# Patient Record
Sex: Male | Born: 1970 | Hispanic: Yes | Marital: Married | Smoking: Never smoker
Health system: Southern US, Community
[De-identification: ages and names within clinical notes are randomized; demographics above are authoritative.]

## PROBLEM LIST (undated history)

## (undated) DIAGNOSIS — M1990 Unspecified osteoarthritis, unspecified site: Secondary | ICD-10-CM

## (undated) HISTORY — PX: NO PAST SURGERIES: SHX2092

---

## 2002-01-14 ENCOUNTER — Encounter: Payer: Self-pay | Admitting: Internal Medicine

## 2002-01-14 ENCOUNTER — Ambulatory Visit (HOSPITAL_COMMUNITY): Admission: RE | Admit: 2002-01-14 | Discharge: 2002-01-14 | Payer: Self-pay | Admitting: Internal Medicine

## 2005-08-22 ENCOUNTER — Ambulatory Visit: Payer: Self-pay | Admitting: Family Medicine

## 2005-08-27 ENCOUNTER — Ambulatory Visit: Payer: Self-pay | Admitting: Family Medicine

## 2007-10-21 ENCOUNTER — Ambulatory Visit: Payer: Self-pay | Admitting: Family Medicine

## 2012-06-09 ENCOUNTER — Encounter (HOSPITAL_COMMUNITY): Payer: Self-pay

## 2012-06-09 ENCOUNTER — Emergency Department (INDEPENDENT_AMBULATORY_CARE_PROVIDER_SITE_OTHER): Payer: Self-pay

## 2012-06-09 ENCOUNTER — Emergency Department (INDEPENDENT_AMBULATORY_CARE_PROVIDER_SITE_OTHER)
Admission: EM | Admit: 2012-06-09 | Discharge: 2012-06-09 | Disposition: A | Discharge status: Home / Self Care | Payer: Self-pay | Source: Home / Self Care | Attending: Family Medicine | Admitting: Family Medicine

## 2012-06-09 DIAGNOSIS — S6990XA Unspecified injury of unspecified wrist, hand and finger(s), initial encounter: Secondary | ICD-10-CM

## 2012-06-09 DIAGNOSIS — S63619A Unspecified sprain of unspecified finger, initial encounter: Secondary | ICD-10-CM

## 2012-06-09 DIAGNOSIS — S6390XA Sprain of unspecified part of unspecified wrist and hand, initial encounter: Secondary | ICD-10-CM

## 2012-06-09 DIAGNOSIS — S6980XA Other specified injuries of unspecified wrist, hand and finger(s), initial encounter: Secondary | ICD-10-CM

## 2012-06-09 MED ORDER — MELOXICAM 7.5 MG PO TABS: 7.5000 mg | ORAL_TABLET | Freq: Every day | ORAL | Status: DC

## 2012-06-09 NOTE — ED Notes (Signed)
59 week old injury from volley ball left ring finger PIP joint reddened and swollen

## 2012-06-09 NOTE — ED Provider Notes (Signed)
History     CSN: [id number]  Arrival date & time 06/09/12  1609   First MD Initiated Contact with Patient 06/09/12 1759      Chief Complaint  Patient presents with  . Finger Injury    (Consider location/radiation/quality/duration/timing/severity/associated sxs/prior treatment) The history is provided by the patient.   Delwyn Scoggin is a 41 y.o. male who sustained a right ring finger injury 3 weeks ago.  Mechanism of injury: playing volleyball, finger hyperextension.  Immediate symptoms: pain, "popping sound". Symptoms have been improved but persistent redness and swelling since that time. Has worn a finger splint for the past three weeks, expresses concern regarding inability to bend finger.  No prior history of finger injury.   History reviewed. No pertinent past medical history.  History reviewed. No pertinent past surgical history.  History reviewed. No pertinent family history.  History  Substance Use Topics  . Smoking status: Not on file  . Smokeless tobacco: Not on file  . Alcohol Use: Not on file      Review of Systems  Constitutional: Negative.   Respiratory: Negative.   Cardiovascular: Negative.   Musculoskeletal: Positive for joint swelling and arthralgias. Negative for myalgias, back pain and gait problem.  Skin: Negative.   Neurological: Negative.     Allergies  Review of patient's allergies indicates not on file.  Home Medications   Current Outpatient Rx  Name Route Sig Dispense Refill  . MELOXICAM 7.5 MG PO TABS Oral Take 1 tablet (7.5 mg total) by mouth daily. 30 tablet 1    BP 130/82  Pulse 56  Temp 99 F (37.2 C) (Oral)  Resp 16  SpO2 98%  Physical Exam  Nursing note and vitals reviewed. Constitutional: He is oriented to person, place, and time. Vital signs are normal. He appears well-developed and well-nourished. He is active and cooperative.  HENT:  Head: Normocephalic.  Eyes: Conjunctivae normal are normal. Pupils are equal,  round, and reactive to light. No scleral icterus.  Neck: Trachea normal. Neck supple.  Cardiovascular: Normal rate, regular rhythm, normal heart sounds and intact distal pulses.   Pulmonary/Chest: Effort normal and breath sounds normal.  Musculoskeletal:       Left wrist: Normal.       Right hand: Normal.       Hands:      Redness at fourth pip, pt unable to make fist, swelling at joint.  Neurological: He is alert and oriented to person, place, and time. No cranial nerve deficit or sensory deficit.  Skin: Skin is warm and dry.  Psychiatric: He has a normal mood and affect. His speech is normal and behavior is normal. Judgment and thought content normal. Cognition and memory are normal.    ED Course  Procedures (including critical care time)  Labs Reviewed - No data to display Dg Finger Ring Left  06/09/2012  *RADIOLOGY REPORT*  Clinical Data: Sports injury fourth digit  LEFT RING FINGER 2+V  Comparison: None.  Findings: There is soft tissue swelling along the fourth digit.  No fracture or dislocation.  No foreign body.  IMPRESSION:  1.  No fracture or dislocation of the fourth digit. 2.   Soft tissue swelling.   Original Report Authenticated By: Genevive Bi, M.D.      1. Finger injury   2. Finger sprain       MDM  NSAIDS, Buddy tape fingers.  Follow up with ortho this week for further evaluation and treatment.  Johnsie Kindred, NP 06/09/12 1924

## 2012-06-13 NOTE — ED Provider Notes (Signed)
Medical screening examination/treatment/procedure(s) were performed by resident physician or non-physician practitioner and as supervising physician I was immediately available for consultation/collaboration.   Tony Granquist DOUGLAS MD.    Cassey Bacigalupo D Donata Reddick, MD 06/13/12 0927 

## 2014-06-02 ENCOUNTER — Ambulatory Visit (INDEPENDENT_AMBULATORY_CARE_PROVIDER_SITE_OTHER): Payer: 59 | Admitting: Medical

## 2014-06-02 ENCOUNTER — Encounter: Payer: Self-pay | Admitting: Medical

## 2014-06-02 ENCOUNTER — Ambulatory Visit: Payer: Self-pay | Admitting: Medical

## 2014-06-02 VITALS — BP 140/90 | HR 70 | Temp 98.1°F | Resp 16 | Ht 68.0 in | Wt 215.0 lb

## 2014-06-02 DIAGNOSIS — R42 Dizziness and giddiness: Secondary | ICD-10-CM

## 2014-06-02 DIAGNOSIS — M25519 Pain in unspecified shoulder: Secondary | ICD-10-CM

## 2014-06-02 DIAGNOSIS — M25569 Pain in unspecified knee: Secondary | ICD-10-CM

## 2014-06-02 DIAGNOSIS — M25511 Pain in right shoulder: Secondary | ICD-10-CM

## 2014-06-02 DIAGNOSIS — R030 Elevated blood-pressure reading, without diagnosis of hypertension: Secondary | ICD-10-CM

## 2014-06-02 DIAGNOSIS — M25562 Pain in left knee: Secondary | ICD-10-CM

## 2014-06-02 MED ORDER — MECLIZINE HCL 25 MG PO TABS: 25.0000 mg | ORAL_TABLET | Freq: Two times a day (BID) | ORAL | Status: DC
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
## 2015-10-18 ENCOUNTER — Ambulatory Visit (INDEPENDENT_AMBULATORY_CARE_PROVIDER_SITE_OTHER): Payer: BLUE CROSS/BLUE SHIELD | Admitting: Physician Assistant

## 2015-10-18 VITALS — BP 124/80 | HR 59 | Temp 98.7°F | Resp 17 | Ht 68.0 in | Wt 229.0 lb

## 2015-10-18 DIAGNOSIS — R519 Headache, unspecified: Secondary | ICD-10-CM

## 2015-10-18 DIAGNOSIS — R51 Headache: Secondary | ICD-10-CM | POA: Diagnosis not present

## 2015-10-18 DIAGNOSIS — R1012 Left upper quadrant pain: Secondary | ICD-10-CM | POA: Diagnosis not present

## 2015-10-18 LAB — POCT URINALYSIS DIP (MANUAL ENTRY)
Bilirubin, UA: NEGATIVE
Blood, UA: NEGATIVE
Glucose, UA: NEGATIVE
Ketones, POC UA: NEGATIVE
Nitrite, UA: NEGATIVE
Protein Ur, POC: NEGATIVE
Spec Grav, UA: 1.025
Urobilinogen, UA: 0.2
pH, UA: 6.5

## 2015-10-18 LAB — POCT CBC
Granulocyte percent: 67.9 %G (ref 37–80)
HCT, POC: 47.4 % (ref 43.5–53.7)
Hemoglobin: 16.8 g/dL (ref 14.1–18.1)
Lymph, poc: 1.9 (ref 0.6–3.4)
MCH, POC: 30.3 pg (ref 27–31.2)
MCHC: 35.4 g/dL (ref 31.8–35.4)
MCV: 85.6 fL (ref 80–97)
MID (cbc): 0.5 (ref 0–0.9)
MPV: 7.4 fL (ref 0–99.8)
POC Granulocyte: 5.2 (ref 2–6.9)
POC LYMPH PERCENT: 25.4 %L (ref 10–50)
POC MID %: 6.7 %M (ref 0–12)
Platelet Count, POC: 235 10*3/uL (ref 142–424)
RBC: 5.53 M/uL (ref 4.69–6.13)
RDW, POC: 13.1 %
WBC: 7.6 10*3/uL (ref 4.6–10.2)

## 2015-10-18 LAB — POC MICROSCOPIC URINALYSIS (UMFC)

## 2015-10-18 LAB — POCT SEDIMENTATION RATE: POCT SED RATE: 15 mm/h (ref 0–22)

## 2015-10-18 MED ORDER — IBUPROFEN 600 MG PO TABS: 600.0000 mg | ORAL_TABLET | Freq: Three times a day (TID) | ORAL | Status: DC | PRN

## 2015-10-18 NOTE — Progress Notes (Signed)
[facility] [address], [place] [address] [phone] [phone]  Date:  10/18/2015   Name:  [name]   DOB:  01/02/1971   MRN:  [id number]  PCP:  Ernst Breach, PA-C   Chief Complaint  Patient presents with  . Chest Pain  . Headache    History of Present Illness:  [name] is a 45 y.o. male patient who presents to [facility] for cc of chest pain.   Left sided chest pain 3-4 years of pain.    1 week ago, he was laying on the ground, starts hurting a little pain.  Aggravated by movement.  It is aggravated by deep breathing.  The bowel movements have no blood, however they are green.  He is not taking anything for this.  Worrying also aggravates the pain.   Pain at right temple started 4 months ago, that comes and goes.  Touching hurts.  There is some numbness when it happens.  There is only mild pain.  Vision has slowly become blurry.  Head pain is not associated with vision changes.  No chest pain, diaphoresis, shortness of breath, or nausea.  The right side of his head with pain.     There are no active problems to display for this patient.   No past medical history on file.  No past surgical history on file.  Social History  Substance Use Topics  . Smoking status: Never Smoker   . Smokeless tobacco: None  . Alcohol Use: None    No family history on file.  No Known Allergies  Medication list has been reviewed and updated.  Current Outpatient Prescriptions on File Prior to Visit  Medication Sig Dispense Refill  . cetirizine (ZYRTEC) 10 MG tablet Take 10 mg by mouth daily.    . meclizine (ANTIVERT) 25 MG tablet Take 1 tablet (25 mg total) by mouth 2 (two) times daily. (Patient not taking: Reported on 10/18/2015) 30 tablet 0   No current facility-administered medications on file prior to visit.    ROS ROS otherwise unremarkable unless listed above.    Physical Examination: BP 124/80 mmHg  Pulse 59  Temp(Src) 98.7 F (37.1 C) (Oral)   Resp 17  Ht  (1.727 m)  Wt 229 lb (103.874 kg)  BMI 34.83 kg/m2  SpO2 99% Ideal Body Weight: Weight in (lb) to have BMI = 25: 164.1  Physical Exam  Constitutional: He is oriented to person, place, and time. He appears well-developed and well-nourished. No distress.  HENT:  Head: Normocephalic and atraumatic.  Eyes: Conjunctivae and EOM are normal. Pupils are equal, round, and reactive to light.  Cardiovascular: Normal rate, regular rhythm and intact distal pulses.  Exam reveals no friction rub.   No murmur heard. Pulses:      Radial pulses are 2+ on the right side, and 2+ on the left side.       Dorsalis pedis pulses are 2+ on the right side, and 2+ on the left side.  Pulmonary/Chest: Effort normal. No accessory muscle usage. No apnea. No respiratory distress. He has no decreased breath sounds. He has no wheezes. He has no rhonchi. He exhibits no tenderness and no swelling.  Abdominal: Soft. Normal appearance and bowel sounds are normal. There is no hepatosplenomegaly. There is tenderness.    l quadrant nodule at the abdomen that is rounded, and mobile.  Tender at the ruq area.    Neurological: He is alert and oriented to person, place,  and time. Coordination normal.  Skin: Skin is warm and dry. He is not diaphoretic.  Psychiatric: He has a normal mood and affect. His behavior is normal.   Results for orders placed or performed in visit on 10/18/15  POCT CBC  Result Value Ref Range   WBC 7.6 4.6 - 10.2 K/uL   Lymph, poc 1.9 0.6 - 3.4   POC LYMPH PERCENT 25.4 10 - 50 %L   MID (cbc) 0.5 0 - 0.9   POC MID % 6.7 0 - 12 %M   POC Granulocyte 5.2 2 - 6.9   Granulocyte percent 67.9 37 - 80 %G   RBC 5.53 4.69 - 6.13 M/uL   Hemoglobin 16.8 14.1 - 18.1 g/dL   HCT, POC 16.1 09.6 - 53.7 %   MCV 85.6 80 - 97 fL   MCH, POC 30.3 27 - 31.2 pg   MCHC 35.4 31.8 - 35.4 g/dL   RDW, POC 04.5 %   Platelet Count, POC 235 142 - 424 K/uL   MPV 7.4 0 - 99.8 fL  POCT urinalysis dipstick    Result Value Ref Range   Color, UA yellow yellow   Clarity, UA clear clear   Glucose, UA negative negative   Bilirubin, UA negative negative   Ketones, POC UA negative negative   Spec Grav, UA 1.025    Blood, UA negative negative   pH, UA 6.5    Protein Ur, POC negative negative   Urobilinogen, UA 0.2    Nitrite, UA Negative Negative   Leukocytes, UA Trace (A) Negative  POCT Microscopic Urinalysis ([facility])  Result Value Ref Range   WBC,UR,HPF,POC Few (A) None WBC/hpf   RBC,UR,HPF,POC None None RBC/hpf   Bacteria Few (A) None, Too numerous to count   Mucus Present (A) Absent   Epithelial Cells, UR Per Microscopy Few (A) None, Too numerous to count cells/hpf   Assessment and Plan: [name] is a 45 y.o. male who is here today for cc of upper left abdomen pain.   Advised advil.  Cbc wnl.  This appears to be musculoskeletal.   At this time, patient would like neurology consult.  This appears to be a vague diagnosis of Bell's.  Sed rate appears unconcerning.    LUQ abdominal pain - Plan: POCT CBC, POCT urinalysis dipstick, POCT Microscopic Urinalysis ([facility]), US Abdomen Complete  Headache, unspecified headache type - Plan: ibuprofen (ADVIL,MOTRIN) 600 MG tablet, POCT SEDIMENTATION RATE, Ambulatory referral to Neurology  Trena Platt, PA-C [facility] 1/31/20178:56 PM

## 2015-10-18 NOTE — Patient Instructions (Addendum)
Your blood work appears fine.   I will order a metabolic panel to check your kidney function at this time. I would like to have an ultrasound.  Please await contact for imaging appointment.   You can take ibuprofen  every 8 hours for the pain.  Make sure that you eat first.

## 2015-10-27 ENCOUNTER — Other Ambulatory Visit: Payer: Self-pay

## 2015-10-28 ENCOUNTER — Other Ambulatory Visit: Payer: Self-pay | Admitting: *Deleted

## 2015-10-28 ENCOUNTER — Ambulatory Visit: Admission: RE | Admit: 2015-10-28 | Payer: BLUE CROSS/BLUE SHIELD | Source: Ambulatory Visit

## 2015-11-04 ENCOUNTER — Ambulatory Visit: Payer: BLUE CROSS/BLUE SHIELD | Admitting: Diagnostic Neuroimaging

## 2016-07-25 ENCOUNTER — Ambulatory Visit (INDEPENDENT_AMBULATORY_CARE_PROVIDER_SITE_OTHER): Payer: BLUE CROSS/BLUE SHIELD | Admitting: Urgent Care

## 2016-07-25 VITALS — BP 110/68 | HR 64 | Temp 98.6°F | Resp 18 | Ht 68.0 in | Wt 232.0 lb

## 2016-07-25 DIAGNOSIS — K429 Umbilical hernia without obstruction or gangrene: Secondary | ICD-10-CM

## 2016-07-25 DIAGNOSIS — G8929 Other chronic pain: Secondary | ICD-10-CM

## 2016-07-25 DIAGNOSIS — Z87828 Personal history of other (healed) physical injury and trauma: Secondary | ICD-10-CM

## 2016-07-25 DIAGNOSIS — R51 Headache: Secondary | ICD-10-CM

## 2016-07-25 DIAGNOSIS — R0789 Other chest pain: Secondary | ICD-10-CM | POA: Diagnosis not present

## 2016-07-25 LAB — POCT URINALYSIS DIP (MANUAL ENTRY)
BILIRUBIN UA: NEGATIVE
BILIRUBIN UA: NEGATIVE
GLUCOSE UA: NEGATIVE
LEUKOCYTES UA: NEGATIVE
Nitrite, UA: NEGATIVE
Protein Ur, POC: NEGATIVE
Spec Grav, UA: 1.025
Urobilinogen, UA: 0.2
pH, UA: 5.5

## 2016-07-25 NOTE — Patient Instructions (Addendum)
Dolor de cabeza general sin causa (General Headache Without Cause) El dolor de cabeza es un dolor o malestar que se siente en la zona de la cabeza o del cuello. Puede no tener una causa especfica. Hay muchas causas y tipos de dolores de Turkmenistancabeza. Los dolores de cabeza ms comunes son los siguientes:  Cefalea tensional.  Cefaleas migraosas.  Cefalea en brotes.  Cefaleas diarias crnicas. INSTRUCCIONES PARA EL CUIDADO EN EL HOGAR  Controle su afeccin para ver si hay cambios. Siga estos pasos para Scientist, physiologicalcontrolar la afeccin: Control del Reynolds Americandolor  Tome los medicamentos de venta libre y los recetados solamente como se lo haya indicado el mdico.  Cuando sienta dolor de cabeza acustese en un cuarto oscuro y tranquilo.  Si se lo indican, aplique hielo sobre la cabeza y la zona del cuello:  Ponga el hielo en una bolsa plstica.  Coloque una toalla entre la piel y la bolsa de hielo.  Coloque el hielo durante 20minutos, 2 a 3veces por Futures traderda.  Utilice una almohadilla trmica o tome una ducha con agua caliente para aplicar calor en la cabeza y la zona del cuello como se lo haya indicado el mdico.  Mantenga las luces tenues si le Liz Claibornemolesta las luces brillantes o sus dolores de cabeza empeoran. Comida y bebida  Mantenga un horario para las comidas.  Limite el consumo de bebidas alcohlicas.  Consuma menos cantidad de cafena o deje de tomarla. Instrucciones generales  Concurra a todas las visitas de control como se lo haya indicado el mdico. Esto es importante.  Lleve un diario de los dolores de cabeza para Financial risk analystaveriguar qu factores pueden desencadenarlos. Por ejemplo, escriba los siguientes datos:  Lo que usted come y Estate agentbebe.  Cunto tiempo duerme.  Algn cambio en su dieta o en los medicamentos.  Pruebe algunas tcnicas de relajacin, como los Stewartvillemasajes.  Limite el estrs.  Sintese con la espalda recta y no tense los msculos.  No consuma productos que contengan tabaco, incluidos  cigarrillos, tabaco de Theatre managermascar o cigarrillos electrnicos. Si necesita ayuda para dejar de fumar, consulte al mdico.  Haga actividad fsica habitualmente como se lo haya indicado el mdico.  Tenga un horario fijo para dormir. Duerma entre 7 y 9horas o la cantidad de horas que le haya recomendado el mdico. SOLICITE ATENCIN MDICA SI:   Los medicamentos no Materials engineerlogran aliviar los sntomas.  Tiene un dolor de cabeza que es diferente del dolor de cabeza habitual.  Tiene nuseas o vmitos.  Tiene fiebre. SOLICITE ATENCIN MDICA DE INMEDIATO SI:   El dolor se hace cada vez ms intenso.  Ha vomitado repetidas veces.  Presenta rigidez en el cuello.  Sufre prdida de la visin.  Tiene problemas para hablar.  Siente dolor en el ojo o en el odo.  Presenta debilidad muscular o prdida del control muscular.  Pierde el equilibrio o tiene problemas para Advertising account plannercaminar.  Sufre mareos o se desmaya.  Se siente confundido.   Esta informacin no tiene Theme park managercomo fin reemplazar el consejo del mdico. Asegrese de hacerle al mdico cualquier pregunta que tenga.   Document Released: 06/13/2005 Document Revised: 05/25/2015 Elsevier Interactive Patient Education 2016 Arvin[employer]Elsevier Inc.     Dolor de pecho inespecfico  (Nonspecific Chest Pain) El dolor de pecho puede deberse a muchas enfermedades diferentes. Siempre existe una posibilidad de que el dolor est relacionado con algo grave, como un infarto de miocardio o un cogulo sanguneo en los pulmones. Hay muchas enfermedades que no son potencialmente mortales que pueden causar dolor  de pecho. Si tiene Engineer, mining de Hotel manager, es muy importante que se controle con el mdico. CAUSAS  Las causas del dolor de pecho pueden ser las siguientes:  Acidez estomacal.  Neumona o bronquitis.  Ansiedad o estrs.  Inflamacin de la zona que rodea al corazn (pericarditis) o a los pulmones (pleuritis o pleuresa).  Un cogulo sanguneo en el pulmn.  Colapso de un  pulmn (neumotrax), que puede aparecer de [employer] repentina por s solo (neumotrax espontneo) o debido a un traumatismo en el trax.  Culebrilla (virus de la varicela zster).  Infarto de miocardio.  Dao de los [place], los msculos y los cartlagos que conforman la pared torcica. Esto puede incluir lo siguiente:  Hematomas seos debido a lesiones.  Distensiones musculares o de los cartlagos por tos frecuente o repetida, o por exceso de trabajo.  Fractura de una o ms costillas.  Dolor de TEFL teacher debido a inflamacin (costocondritis). FACTORES DE RIESGO  Los factores de riesgo de tener dolor de pecho pueden incluir lo siguiente:  Actividades que incrementan el riesgo de sufrir traumatismos o lesiones en el trax.  Infecciones o enfermedades respiratorias que causan tos frecuente.  Enfermedades o [employer] comidas que pueden causar Engineering geologist.  Enfermedades cardacas o antecedentes familiares de enfermedades cardacas.  Enfermedades o comportamientos de salud que aumentan el riesgo de tener un cogulo sanguneo.  Haber tenido varicela (varicela zster). SIGNOS Y SNTOMAS El dolor de pecho puede provocar las siguientes sensaciones:  Ardor u hormigueo en la superficie o en lo profundo del pecho.  Dolor opresivo, continuo o constrictivo.  Dolor vago o intenso que empeora al [employer], toser o inhalar profundamente.  Dolor que tambin se siente en la espalda, el cuello, el hombro o el brazo, o dolor que se irradia a cualquiera de estas zonas. El dolor de pecho puede aparecer y Geneticist, molecular, o bien puede ser constante. DIAGNSTICO Gretchen Short se necesiten anlisis de laboratorio u otros estudios para Veterinary surgeon causa del Engineer, mining. El mdico puede indicarle que se haga una prueba llamada EGC (electrocadiograma) ambulatorio. El Regulatory affairs officer los patrones de los latidos cardacos en el momento en que se realiza el [place]. Tambin pueden hacerle otros estudios, por  ejemplo:  Ecocardiograma transtorcico (ETT). Durante el ecocardiograma, se usan ondas sonoras para crear una imagen de todas las estructuras cardacas y evaluar cmo circula la sangre por el corazn.  Ecocardiograma transesofgico (ETE).Este es un estudio de diagnstico por imgenes ms avanzado que el obtiene imgenes del interior del cuerpo. Le permite al mdico ver el corazn con mayor detalle.  Monitoreo cardaco. Permite que el mdico controle la frecuencia y el ritmo cardaco en tiempo real.  Monitor Holter. Es un dispositivo porttil que eBay latidos del corazn y puede ayudar a Education administrator las arritmias cardacas. Le permite al American Express registrar la actividad cardaca durante varios das, si es necesario.  Pruebas de esfuerzo. Estas pueden realizarse durante el ejercicio o mediante la administracin de un medicamento que acelera los latidos del corazn.  Anlisis de [place].  Diagnstico por imgenes. TRATAMIENTO  El tratamiento depende de la causa del dolor de [place]. El tratamiento puede incluir lo siguiente:  Medicamentos. Estos pueden incluir lo siguiente:  Inhibidores de Publishing copy.  Antiinflamatorios.  Analgsicos para las enfermedades inflamatorias.  Antibiticos, si hay una infeccin.  Medicamentos para [employer].  Medicamentos para tratar la enfermedad arterial coronaria.  Tratamiento complementario para las enfermedades que no requieren la toma de medicamentos. Esto puede incluir lo siguiente:  Descansar.  Aplicar compresas fras o calientes en las zonas lesionadas.  Limitar las actividades hasta que Erie Insurance Groupdisminuya el dolor. INSTRUCCIONES PARA EL CUIDADO EN EL HOGAR  Si le recetaron antibiticos, asegrese de terminarlos, incluso si comienza a sentirse mejor.  Evite las SUPERVALU INCactividades que le causen dolor de Ottumwapecho.  No consuma ningn producto que contenga tabaco, lo que incluye cigarrillos, tabaco de Theatre managermascar o Soil scientistcigarrillos  electrnicos. Si necesita ayuda para dejar de fumar, consulte al mdico.  No beba alcohol.  Tome los medicamentos solamente como se lo haya indicado el mdico.  Concurra a todas las visitas de control como se lo haya indicado el mdico. Esto es importante. Esto incluye otros estudios si el dolor de pecho no desaparece.  Si la acidez es la causa del dolor de Eriepecho, tal vez le aconsejen que mantenga la cabeza levantada (elevada) mientras duerme. Esto reduce la probabilidad de que el cido retroceda del estmago al esfago.  Haga cambios en su estilo de vida como se lo haya indicado el mdico. Estos pueden incluir lo siguiente:  Education administratorracticar actividad fsica con regularidad. Pida al mdico que le sugiera algunas actividades que sean seguras para usted.  Consumir una dieta cardiosaludable. Un nutricionista matriculado puede ayudarlo a Software engineerhacer elecciones saludables.  Mantener un peso saludable.  Controlar la diabetes, si es necesario.  Reducir las situaciones de estrs. SOLICITE ATENCIN MDICA SI:  El dolor de pecho no desaparece despus del tratamiento.  Tiene una erupcin cutnea con ampollas en el pecho.  Tiene fiebre. SOLICITE ATENCIN MDICA DE INMEDIATO SI:   El dolor en el pecho es ms intenso.  La tos empeora, o expectora sangre.  Siente un dolor abdominal intenso.  Siente debilidad intensa.  Se desmaya.  Tiene escalofros.  Tiene una molestia repentina e inexplicable en el pecho.  Tiene molestias repentinas e Exxon Mobil Corporationinexplicables en los brazos, la espalda, el cuello o la Tonopahmandbula.  Le falta el aire en cualquier momento.  Comienza a sudar de Hondurasmanera repentina o la piel se le humedece.  Siente nuseas o vomita.  Se siente repentinamente mareado o se desmaya.  Siente que el corazn comienza a latir rpidamente o que se saltea latidos. Estos sntomas pueden representar un problema grave que constituye Radio broadcast assistantuna emergencia. No espere hasta que los sntomas desaparezcan. Solicite  atencin mdica de inmediato. Comunquese con el servicio de emergencias de su localidad (911 en los [place]). No conduzca por sus propios medios OfficeMax Incorporatedhasta el hospital.   Esta informacin no tiene Theme park managercomo fin reemplazar el consejo del mdico. Asegrese de hacerle al mdico cualquier pregunta que tenga.   Document Released: 09/03/2005 Document Revised: 09/24/2014 Elsevier Interactive Patient Education Yahoo[employer] Inc2016 Elsevier Inc.     IF you received an x-ray today, you will receive an invoice from [facility]. Please contact [facility] at [phone] with questions or concerns regarding your invoice.   IF you received labwork today, you will receive an invoice from [facility]. Please contact [facility] at [phone] with questions or concerns regarding your invoice.   Our billing staff will not be able to assist you with questions regarding bills from these companies.  You will be contacted with the lab results as soon as they are available. The fastest way to get your results is to activate your My Chart account. Instructions are located on the last page of this paperwork. If you have not heard from us regarding the results in 2 weeks, please contact this office.

## 2016-07-25 NOTE — Progress Notes (Signed)
MRN: [id number] DOB: [birth year]  Subjective:   [name] is a 45 y.o. male presenting for chief complaint of HEAD PAIN  Reports ~2.5 year history of intermittent headaches, s/[place] head [place] from [place]. Patient was hospitalized for this, has not had f/u or neurology referral since then. In the past 2 months they became worse. Headache is located over occiput, can last hours, associated with tingling, slightly blurred vision. Has tried naproxen with some relief.   After physical exam, patient reports several month history of intermittent mid-sternal chest pain, not associated with strenuous activity or eating. This is elicited with bending. He feels it is more chest wall type pain. Denies diaphoresis, heart racing, palpitations, radiation of his chest wall pain to his neck, jaw, left arm, n/v, abdominal pain. Denies smoking cigarettes.  Dominga Ferrygustin currently has no medications in their medication list. Also has No Known Allergies.  Dominga Ferrygustin  has no past medical history on file. Also  has no past surgical history on file.   Objective:   Vitals: BP 110/68 (BP Location: Right Arm, Patient Position: Sitting, Cuff Size: Large)   Pulse 64   Temp 98.6 F (37 C) (Oral)   Resp 18   Ht 5\' 8"  (1.727 m)   Wt 232 lb (105.2 kg)   SpO2 98%   BMI 35.28 kg/m   Physical Exam  Constitutional: He is oriented to person, place, and time. He appears well-developed and well-nourished.  HENT:  TM's intact bilaterally, no effusions or erythema. Nasal turbinates pink and moist, nasal passages patent. No sinus tenderness. Oropharynx clear, mucous membranes moist, dentition in good repair.  Eyes: EOM are normal. Pupils are equal, round, and reactive to light. No scleral icterus.  Cardiovascular: Normal rate, regular rhythm and intact distal pulses.  Exam reveals no gallop and no friction rub.   No murmur heard. Pulmonary/Chest: No respiratory distress. He has no wheezes. He has no rales.    Abdominal: Soft. Bowel sounds are normal. He exhibits no distension and no mass. There is no tenderness. A hernia (umbilical) is present.  Neurological: He is alert and oriented to person, place, and time. No cranial nerve deficit. Coordination normal.  Skin: Skin is warm and dry.   Results for orders placed or performed in visit on 07/25/16 (from the past 24 hour(s))  POCT urinalysis dipstick     Status: Abnormal   Collection Time: 07/25/16  6:24 PM  Result Value Ref Range   Color, UA yellow yellow   Clarity, UA clear clear   Glucose, UA negative negative   Bilirubin, UA negative negative   Ketones, POC UA negative negative   Spec Grav, UA 1.025    Blood, UA trace-intact (A) negative   pH, UA 5.5    Protein Ur, POC negative negative   Urobilinogen, UA 0.2    Nitrite, UA Negative Negative   Leukocytes, UA Negative Negative   ECG interpretation - sinus rhythm.  Assessment and Plan :   1. Chronic nonintractable headache, unspecified headache type 2. History of head injury without fracture of skull - May be due to dehydration, stress. Counseled patient on lifestyle modifications. Labs are pending. Consider referral to Neuro.  3. Chest wall pain - Monitor, f/u with annual physical exam.  4. Umbilical hernia without obstruction and without gangrene - Offered referral to general surgery for consult, patient refused. - Comprehensive metabolic panel   Wallis BambergMario Jaciel Diem, PA-C [facility] [phone] 07/25/2016 6:00  PM

## 2016-07-26 LAB — COMPREHENSIVE METABOLIC PANEL
ALK PHOS: 85 U/L (ref 40–115)
ALT: 36 U/L (ref 9–46)
AST: 27 U/L (ref 10–40)
Albumin: 4.4 g/dL (ref 3.6–5.1)
BILIRUBIN TOTAL: 0.5 mg/dL (ref 0.2–1.2)
BUN: 16 mg/dL (ref 7–25)
CO2: 26 mmol/L (ref 20–31)
CREATININE: 0.99 mg/dL (ref 0.60–1.35)
Calcium: 9.5 mg/dL (ref 8.6–10.3)
Chloride: 103 mmol/L (ref 98–110)
GLUCOSE: 100 mg/dL — AB (ref 65–99)
Potassium: 3.9 mmol/L (ref 3.5–5.3)
SODIUM: 137 mmol/L (ref 135–146)
Total Protein: 7.6 g/dL (ref 6.1–8.1)

## 2016-07-26 LAB — CBC
HCT: 46.3 % (ref 38.5–50.0)
HEMOGLOBIN: 16.3 g/dL (ref 13.2–17.1)
MCH: 30.6 pg (ref 27.0–33.0)
MCHC: 35.2 g/dL (ref 32.0–36.0)
MCV: 87 fL (ref 80.0–100.0)
MPV: 10 fL (ref 7.5–12.5)
PLATELETS: 264 10*3/uL (ref 140–400)
RBC: 5.32 MIL/uL (ref 4.20–5.80)
RDW: 13.9 % (ref 11.0–15.0)
WBC: 7.2 10*3/uL (ref 3.8–10.8)

## 2016-07-26 LAB — HEMOGLOBIN A1C
HEMOGLOBIN A1C: 5.4 % (ref <5.7)
MEAN PLASMA GLUCOSE: 108 mg/dL

## 2017-09-25 DIAGNOSIS — M25562 Pain in left knee: Secondary | ICD-10-CM | POA: Diagnosis not present

## 2017-09-25 DIAGNOSIS — M1712 Unilateral primary osteoarthritis, left knee: Secondary | ICD-10-CM | POA: Diagnosis not present

## 2017-10-14 DIAGNOSIS — M1712 Unilateral primary osteoarthritis, left knee: Secondary | ICD-10-CM | POA: Diagnosis not present

## 2017-10-14 DIAGNOSIS — M25562 Pain in left knee: Secondary | ICD-10-CM | POA: Diagnosis not present

## 2017-10-21 DIAGNOSIS — M25562 Pain in left knee: Secondary | ICD-10-CM | POA: Diagnosis not present

## 2017-10-21 DIAGNOSIS — M1712 Unilateral primary osteoarthritis, left knee: Secondary | ICD-10-CM | POA: Diagnosis not present

## 2017-10-28 DIAGNOSIS — M791 Myalgia, unspecified site: Secondary | ICD-10-CM | POA: Diagnosis not present

## 2017-10-28 DIAGNOSIS — M25562 Pain in left knee: Secondary | ICD-10-CM | POA: Diagnosis not present

## 2017-10-28 DIAGNOSIS — M1712 Unilateral primary osteoarthritis, left knee: Secondary | ICD-10-CM | POA: Diagnosis not present

## 2018-01-27 ENCOUNTER — Ambulatory Visit (INDEPENDENT_AMBULATORY_CARE_PROVIDER_SITE_OTHER): Payer: BLUE CROSS/BLUE SHIELD

## 2018-01-27 ENCOUNTER — Ambulatory Visit: Payer: Self-pay

## 2018-01-27 ENCOUNTER — Ambulatory Visit: Payer: BLUE CROSS/BLUE SHIELD | Admitting: Family Medicine

## 2018-01-27 ENCOUNTER — Other Ambulatory Visit: Payer: Self-pay

## 2018-01-27 ENCOUNTER — Encounter: Payer: Self-pay | Admitting: Family Medicine

## 2018-01-27 VITALS — BP 108/80 | HR 76 | Temp 98.0°F | Resp 16 | Ht 68.9 in | Wt 232.0 lb

## 2018-01-27 DIAGNOSIS — R0789 Other chest pain: Secondary | ICD-10-CM | POA: Diagnosis not present

## 2018-01-27 DIAGNOSIS — R079 Chest pain, unspecified: Secondary | ICD-10-CM | POA: Diagnosis not present

## 2018-01-27 DIAGNOSIS — M542 Cervicalgia: Secondary | ICD-10-CM

## 2018-01-27 DIAGNOSIS — J301 Allergic rhinitis due to pollen: Secondary | ICD-10-CM

## 2018-01-27 DIAGNOSIS — R42 Dizziness and giddiness: Secondary | ICD-10-CM | POA: Diagnosis not present

## 2018-01-27 DIAGNOSIS — Z8669 Personal history of other diseases of the nervous system and sense organs: Secondary | ICD-10-CM | POA: Diagnosis not present

## 2018-01-27 DIAGNOSIS — M1712 Unilateral primary osteoarthritis, left knee: Secondary | ICD-10-CM | POA: Diagnosis not present

## 2018-01-27 LAB — POCT URINALYSIS DIP (MANUAL ENTRY)
BILIRUBIN UA: NEGATIVE
BILIRUBIN UA: NEGATIVE mg/dL
GLUCOSE UA: NEGATIVE mg/dL
Leukocytes, UA: NEGATIVE
Nitrite, UA: NEGATIVE
Protein Ur, POC: NEGATIVE mg/dL
Spec Grav, UA: 1.025 (ref 1.010–1.025)
UROBILINOGEN UA: 0.2 U/dL
pH, UA: 5.5 (ref 5.0–8.0)

## 2018-01-27 LAB — GLUCOSE, POCT (MANUAL RESULT ENTRY): POC GLUCOSE: 86 mg/dL (ref 70–99)

## 2018-01-27 MED ORDER — MELOXICAM 15 MG PO TABS: 15.0000 mg | ORAL_TABLET | Freq: Every day | ORAL | 0 refills | Status: DC | PRN

## 2018-01-27 MED ORDER — MECLIZINE HCL 25 MG PO TABS: 25.0000 mg | ORAL_TABLET | Freq: Three times a day (TID) | ORAL | 0 refills | Status: DC | PRN

## 2018-01-27 NOTE — Telephone Encounter (Signed)
Patient's son, Tonie Griffith, called and patient gave consent to speak to the son. Christiane Ha says "my father got dizzy and sweating last night, but he says today he's better, just a little dizzy." I asked does he have hypertension, he says "the doctors have told him in the past he has hypertension, but he's not on medication for it." I asked is he able to check his BP at home, he says, "no." I asked is he having any other symptoms, he says "he was nauseated and had a little chest pain last night, but it went away and today nothing but a little dizzy. He can walk normally." According to protocol, see PCP within 3 days, he requested to be seen today if possible, appointment scheduled for today at 1720 with Dr. Katrinka Blazing, care advice given, son verbalized understanding.  Reason for Disposition . [1] MILD dizziness (e.g., walking normally) AND [2] has NOT been evaluated by physician for this  (Exception: dizziness caused by heat exposure, sudden standing, or poor fluid intake)  Answer Assessment - Initial Assessment Questions 1. DESCRIPTION: "Describe your dizziness."     Lightheaded 2. LIGHTHEADED: "Do you feel lightheaded?" (e.g., somewhat faint, woozy, weak upon standing)     Lightheaded 3. VERTIGO: "Do you feel like either you or the room is spinning or tilting?" (i.e. vertigo)     Not a lot, but a little 4. SEVERITY: "How bad is it?"  "Do you feel like you are going to faint?" "Can you stand and walk?"   - MILD - walking normally   - MODERATE - interferes with normal activities (e.g., work, school)    - SEVERE - unable to stand, requires support to walk, feels like passing out now.      Mild 5. ONSET:  "When did the dizziness begin?"     Last night 6. AGGRAVATING FACTORS: "Does anything make it worse?" (e.g., standing, change in head position)     Standing made it worse 7. HEART RATE: "Can you tell me your heart rate?" "How many beats in 15 seconds?"  (Note: not all patients can do this)        No 8. CAUSE: "What do you think is causing the dizziness?"     I don't know 9. RECURRENT SYMPTOM: "Have you had dizziness before?" If so, ask: "When was the last time?" "What happened that time?"     No 10. OTHER SYMPTOMS: "Do you have any other symptoms?" (e.g., fever, chest pain, vomiting, diarrhea, bleeding)       Nausea last night, chest hurting last night a little bit 11. PREGNANCY: "Is there any chance you are pregnant?" "When was your last menstrual period?"       N/A  Protocols used: DIZZINESS [facility]
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
## 2018-01-27 NOTE — Patient Instructions (Addendum)
IF you received an x-ray today, you will receive an invoice from [facility]. Please contact [facility] at [phone] with questions or concerns regarding your invoice.   IF you received labwork today, you will receive an invoice from [facility]. Please contact [facility] at [phone] with questions or concerns regarding your invoice.   Our billing staff will not be able to assist you with questions regarding bills from these companies.  You will be contacted with the lab results as soon as they are available. The fastest way to get your results is to activate your My Chart account. Instructions are located on the last page of this paperwork. If you have not heard from [place] regarding the results in 2 weeks, please contact this office.     Maniobra de Energy manager, cuidado personal Merchandiser, retail Self-Care) QU ES LA MANIOBRA DE EPLEY? La [employer] de Epley es un ejercicio que puede Education officer, environmental para Paramedic los sntomas del vrtigo posicional paroxstico benigno (VPPB). Esta afeccin a menudo se conoce como vrtigo. El movimiento de unos pequeos cristales (canalculos) dentro del odo interno ocasiona el VPPB. La acumulacin y el movimiento de los canalculos en el odo interno ocasionan una repentina sensacin de aceleracin (vrtigo) cuando se mueve la cabeza en ciertas posiciones. El vrtigo por lo general dura unos 30das. El VPPB normalmente ocurre solo en un odo. Si siente vrtigo cuando se recuesta sobre el lado izquierdo, probablemente tenga VPPB en el odo izquierdo. El mdico le puede decir qu odo est involucrado. Una lesin en la cabeza puede ocasionar el VPPB. Muchas personas de ms de 50aos tienen VPPB por motivos desconocidos. Si se le diagnostic VPPB, el mdico puede ensearle a Firefighter. El VPPB no es potencialmente mortal (benigno) y normalmente se pasa con el tiempo. CUNDO DEBO REALIZAR LA MANIOBRA DE EPLEY? Puede realizar [employer] en su  casa cuando tenga los sntomas de vrtigo. Puede realizar Musician de Epley hasta 3veces en un da hasta que los sntomas de vrtigo desaparezcan. CMO DEBO REALIZAR LA MANIOBRA DE EPLEY? 1. Sintese en el borde de una cama o una mesa con la espalda recta. Las piernas deben estar extendidas o colgando sobre el borde de la cama o la mesa. 2. Gire la cabeza a medias hacia el lado del odo afectado. 3. Recustese hacia atrs con la cabeza girada hasta que se encuentre recostado sobre la espalda. Quizs quiera colocar una almohada debajo de los hombros. 4. Mantenga esta posicin durante 30segundos. Es posible que experimente un ataque de vrtigo. Esto es normal. Mantenga esta posicin hasta que el vrtigo desaparezca. 5. Luego gire la cabeza en direccin opuesta hasta que el odo no afectado est orientado al suelo. 6. Mantenga esta posicin durante 30segundos. Es posible que experimente un ataque de vrtigo. Esto es normal. Mantenga esta posicin hasta que el vrtigo desaparezca. 7. Ahora gire todo el cuerpo hacia el mismo lado que la cabeza. Mantenga esta posicin durante otros 30segundos. 8. Luego, vuelva a sentarse.  ESTA MANIOBRA PRESENTA RIESGOS? En algunos casos, puede tener otros sntomas (como cambios en la visin, debilidad o entumecimiento). Si tiene estos sntomas, deje de Clinical cytogeneticist y llame al mdico. Baron Sane si realizar esta maniobra lo [place] del vrtigo, es posible que sienta mareos. El mareo es la sensacin de desvanecimiento pero sin la sensacin de [place]. Aunque la [employer] de Energy manager lo [place] del vrtigo, es posible que los sntomas vuelvan durante los siguientes 5aos. QU DEBO HACER DESPUS DE ESTA MANIOBRA? Puede  retomar sus actividades normales despus de Education officer, environmental la [place] de Energy manager. Pregntele al mdico si debe hacer algo en su casa para prevenir el vrtigo. Esta puede incluir:  Dormir con dos o ms almohadas para Pharmacologist la cabeza elevada.  No  dormir sobre el lado del odo afectado.  Levantarse lentamente de la cama.  Evitar los movimientos repentinos Administrator.  Evitar los movimientos de cabeza intensos, como mirar hacia arriba o Public librarian.  Utilizar un collar cervical para evitar los movimientos de cabeza repentinos. QU DEBO HACER SI LOS SNTOMAS EMPEORAN? Llame al mdico si el vrtigo empeora. Llame al mdico inmediatamente si tiene otros sntomas, incluidos:  Nuseas.  Vmitos.  Dolor de [place].  Debilidad.  Entumecimiento.  Cambios en la visin. Esta informacin no tiene Theme park manager el consejo del mdico. Asegrese de hacerle al mdico cualquier pregunta que tenga. Document Released: 09/08/2013 Document Revised: 09/08/2013 Document Reviewed: 07/28/2013 Elsevier Interactive Patient Education  2017 Elsevier Inc.  Vrtigo (Vertigo) El trmino vrtigo hace referencia a la sensacin de que se est moviendo cuando no es as. El vrtigo puede causarle la sensacin de que las cosas que lo rodean se estn moviendo cuando en realidad eso no sucede. Esta sensacin puede aparecer y desaparecer en cualquier momento. El vrtigo suele desaparecer solo. CUIDADOS EN EL HOGAR  No haga movimientos rpidos.  No conduzca.  No use maquinaria pesada.  No haga nada que pueda ser peligroso para usted o para Economist en el caso de que se produjera una crisis de vrtigo.  Sintese de inmediato si est mareado o tiene dificultad para mantener el equilibrio.  Tome los medicamentos de venta libre y los recetados solamente como se lo haya indicado el mdico.  Siga las indicaciones de mdico en lo que respecta a las posiciones o los movimientos que Personnel officer.  Beba suficiente lquido para mantener el pis (orina) claro o de color amarillo plido.  Concurra a todas las visitas de control como se lo haya indicado el mdico. Esto es importante. SOLICITE AYUDA SI:  Los medicamentos no le [employer]  vrtigo.  Tiene fiebre.  Los problemas empeoran o le aparecen sntomas nuevos.  Sus familiares o amigos observan cambios en su comportamiento.  Tiene malestar estomacal (nuseas) o vomita.  Tiene sensacin de hormigueo o se le adormece una parte del cuerpo. SOLICITE AYUDA DE INMEDIATO SI:  Tiene dificultad para moverse o para caminar.  Esta mareado todo [employer].  Pierde el conocimiento (se desmaya).  Tiene dolores de [place] muy intensos.  Se siente dbil o tiene problemas para [employer], los brazos o las piernas.  Tiene cambios en la audicin.  Tiene cambios en la visin.  Tiene rigidez en el cuello.  La luz brillante empieza a molestarlo. Esta informacin no tiene Theme park manager el consejo del mdico. Asegrese de hacerle al mdico cualquier pregunta que tenga. Document Released: 10/06/2010 Document Revised: 05/25/2015 Document Reviewed: 12/27/2014 Elsevier Interactive Patient Education  [employer].

## 2018-01-28 LAB — COMPREHENSIVE METABOLIC PANEL
ALBUMIN: 4.2 g/dL (ref 3.5–5.5)
ALT: 28 IU/L (ref 0–44)
AST: 21 IU/L (ref 0–40)
Albumin/Globulin Ratio: 1.5 (ref 1.2–2.2)
Alkaline Phosphatase: 94 IU/L (ref 39–117)
BUN / CREAT RATIO: 19 (ref 9–20)
BUN: 19 mg/dL (ref 6–24)
Bilirubin Total: 0.3 mg/dL (ref 0.0–1.2)
CALCIUM: 9.1 mg/dL (ref 8.7–10.2)
CHLORIDE: 103 mmol/L (ref 96–106)
CO2: 21 mmol/L (ref 20–29)
CREATININE: 0.99 mg/dL (ref 0.76–1.27)
GFR calc Af Amer: 104 mL/min/{1.73_m2} (ref >59)
GFR, EST NON AFRICAN AMERICAN: 90 mL/min/{1.73_m2} (ref >59)
Globulin, Total: 2.8 g/dL (ref 1.5–4.5)
Glucose: 107 mg/dL — ABNORMAL HIGH (ref 65–99)
Potassium: 4.1 mmol/L (ref 3.5–5.2)
Sodium: 140 mmol/L (ref 134–144)
Total Protein: 7 g/dL (ref 6.0–8.5)

## 2018-01-28 LAB — CBC WITH DIFFERENTIAL/PLATELET
Basophils Absolute: 0 10*3/uL (ref 0.0–0.2)
Basos: 0 %
EOS (ABSOLUTE): 0.2 10*3/uL (ref 0.0–0.4)
EOS: 3 %
HEMOGLOBIN: 15.3 g/dL (ref 13.0–17.7)
Hematocrit: 44.1 % (ref 37.5–51.0)
IMMATURE GRANS (ABS): 0 10*3/uL (ref 0.0–0.1)
IMMATURE GRANULOCYTES: 0 %
Lymphocytes Absolute: 1.8 10*3/uL (ref 0.7–3.1)
Lymphs: 24 %
MCH: 30.4 pg (ref 26.6–33.0)
MCHC: 34.7 g/dL (ref 31.5–35.7)
MCV: 88 fL (ref 79–97)
MONOCYTES: 9 %
Monocytes Absolute: 0.7 10*3/uL (ref 0.1–0.9)
NEUTROS PCT: 64 %
Neutrophils Absolute: 4.8 10*3/uL (ref 1.4–7.0)
Platelets: 234 10*3/uL (ref 150–379)
RBC: 5.04 x10E6/uL (ref 4.14–5.80)
RDW: 13.3 % (ref 12.3–15.4)
WBC: 7.5 10*3/uL (ref 3.4–10.8)

## 2018-02-04 ENCOUNTER — Encounter: Payer: Self-pay | Admitting: Family Medicine

## 2018-02-04 NOTE — Progress Notes (Deleted)
Subjective:    Patient ID: [name], male    DOB: 09-29-1970, 47 y.o.   MRN: [id number]  02/04/2018  No chief complaint on file.    HPI This 47 y.o. male presents for evaluation of ***. BP Readings from Last 3 Encounters:  01/27/18 108/80  07/25/16 110/68  10/18/15 124/80   Wt Readings from Last 3 Encounters:  01/27/18 232 lb (105.2 kg)  07/25/16 232 lb (105.2 kg)  10/18/15 229 lb (103.9 kg)    There is no immunization history on file for this patient.  Review of Systems  No past medical history on file. No past surgical history on file. No Known Allergies Current Outpatient Medications on File Prior to Visit  Medication Sig Dispense Refill  . meclizine (ANTIVERT) 25 MG tablet Take 1 tablet (25 mg total) by mouth 3 (three) times daily as needed for dizziness. 30 tablet 0  . meloxicam (MOBIC) 15 MG tablet Take 1 tablet (15 mg total) by mouth daily as needed for pain. 30 tablet 0   No current facility-administered medications on file prior to visit.    Social History   Socioeconomic History  . Marital status: Married    Spouse name: Not on file  . Number of children: Not on file  . Years of education: Not on file  . Highest education level: Not on file  Occupational History  . Not on file  Social Needs  . Financial resource strain: Not on file  . Food insecurity:    Worry: Not on file    Inability: Not on file  . Transportation needs:    Medical: Not on file    Non-medical: Not on file  Tobacco Use  . Smoking status: Never Smoker  . Smokeless tobacco: Never Used  Substance and Sexual Activity  . Alcohol use: No  . Drug use: No  . Sexual activity: Not on file  Lifestyle  . Physical activity:    Days per week: Not on file    Minutes per session: Not on file  . Stress: Not on file  Relationships  . Social connections:    Talks on phone: Not on file    Gets together: Not on file    Attends religious service: Not on file    Active member of  club or organization: Not on file    Attends meetings of clubs or organizations: Not on file    Relationship status: Not on file  . Intimate partner violence:    Fear of current or ex partner: Not on file    Emotionally abused: Not on file    Physically abused: Not on file    Forced sexual activity: Not on file  Other Topics Concern  . Not on file  Social History Narrative  . Not on file   Family History  Problem Relation Age of Onset  . Hypertension Mother   . Prostate cancer Father   . Diabetes Sister        Objective:    There were no vitals taken for this visit. Physical Exam No results found. Depression screen [facility] 2/9 01/27/2018 07/25/2016 10/18/2015  Decreased Interest 0 0 0  Down, Depressed, Hopeless 0 0 0  PHQ - 2 Score 0 0 0   Fall Risk  01/27/2018 07/25/2016  Falls in the past year? No No        Assessment & Plan:  No diagnosis found.  ***  No orders of the defined types were placed in  this encounter.  No orders of the defined types were placed in this encounter.   No follow-ups on file.   Aurelius Gildersleeve Paulita Fujita, M.D. Primary Care at [facility] previously [facility] [address] [place], [place]  [address] [phone] phone [phone] fax

## 2018-02-06 ENCOUNTER — Encounter: Payer: Self-pay | Admitting: Family Medicine

## 2018-03-24 DIAGNOSIS — M1712 Unilateral primary osteoarthritis, left knee: Secondary | ICD-10-CM | POA: Diagnosis not present

## 2018-04-06 DIAGNOSIS — J301 Allergic rhinitis due to pollen: Secondary | ICD-10-CM | POA: Insufficient documentation

## 2018-04-06 DIAGNOSIS — M1712 Unilateral primary osteoarthritis, left knee: Secondary | ICD-10-CM | POA: Insufficient documentation

## 2018-04-06 DIAGNOSIS — Z8669 Personal history of other diseases of the nervous system and sense organs: Secondary | ICD-10-CM | POA: Insufficient documentation

## 2019-02-20 DIAGNOSIS — M1712 Unilateral primary osteoarthritis, left knee: Secondary | ICD-10-CM | POA: Diagnosis not present

## 2019-10-21 DIAGNOSIS — Z0389 Encounter for observation for other suspected diseases and conditions ruled out: Secondary | ICD-10-CM | POA: Diagnosis not present

## 2019-10-21 DIAGNOSIS — Z6834 Body mass index (BMI) 34.0-34.9, adult: Secondary | ICD-10-CM | POA: Diagnosis not present

## 2019-11-09 ENCOUNTER — Ambulatory Visit (INDEPENDENT_AMBULATORY_CARE_PROVIDER_SITE_OTHER): Payer: BC Managed Care – PPO

## 2019-11-09 ENCOUNTER — Ambulatory Visit: Payer: BC Managed Care – PPO | Admitting: Family Medicine

## 2019-11-09 ENCOUNTER — Encounter: Payer: Self-pay | Admitting: Family Medicine

## 2019-11-09 ENCOUNTER — Other Ambulatory Visit: Payer: Self-pay

## 2019-11-09 ENCOUNTER — Ambulatory Visit: Payer: Self-pay

## 2019-11-09 VITALS — BP 120/86 | HR 76 | Ht 68.0 in | Wt 242.2 lb

## 2019-11-09 DIAGNOSIS — M25562 Pain in left knee: Secondary | ICD-10-CM | POA: Diagnosis not present

## 2019-11-09 DIAGNOSIS — G8929 Other chronic pain: Secondary | ICD-10-CM | POA: Diagnosis not present

## 2019-11-09 DIAGNOSIS — M1712 Unilateral primary osteoarthritis, left knee: Secondary | ICD-10-CM | POA: Diagnosis not present

## 2019-11-09 NOTE — Progress Notes (Signed)
Subjective:    CC: L knee pain  I, Molly Weber, LAT, ATC, am serving as scribe for Dr. Lynne Leader.  HPI: Pt is a 49 y/o male presenting w/ c/o L knee pain x several years that has worsened.  He states that he had been getting injections every 6 months and feels like he has probably gotten 3-4 injections total.  He rates his pain at a 8/10 at it's worst.  Radiating pain:No L knee swelling: No L knee mechanical symptoms: Yes Aggravating factors: Walking; climbing stairs; transitioning from sit-to-stand Treatments tried: Prior knee injections; Naproxen   Pertinent review of Systems: No fevers or chills  Relevant historical information: Works as an Clinical biochemist doing residential work.   Objective:    Vitals:   11/09/19 1610  BP: 120/86  Pulse: 76  SpO2: 96%   General: Well Developed, well nourished, and in no acute distress.   MSK: Left knee: Mild effusion no deformity no erythema. Range of motion 0-100 degrees with crepitation. Not particular tender to palpation. Stable ligamentous exam. Negative Murray's test.  Intact flexion and extension strength.  Lab and Radiology Results X-ray images left knee obtained today personally and independently reviewed Significant DJD medial compartment bone-on-bone.  Small lucency appearing to be loose body present mid knee. Await formal radiology review  Procedure: Real-time Ultrasound Guided Injection of left knee Device: Philips Affiniti 50G Images permanently stored and available for review in the ultrasound unit. Verbal informed consent obtained.  Discussed risks and benefits of procedure. Warned about infection bleeding damage to structures skin hypopigmentation and fat atrophy among others. Patient expresses understanding and agreement Time-out conducted.   Noted no overlying erythema, induration, or other signs of local infection.   Skin prepped in a sterile fashion.   Local anesthesia: Topical Ethyl chloride.   With  sterile technique and under real time ultrasound guidance:  40 mg of Kenalog and 2 mL of lidocaine injected easily.   Completed without difficulty   Pain immediately resolved suggesting accurate placement of the medication.   Advised to call if fevers/chills, erythema, induration, drainage, or persistent bleeding.   Images permanently stored and available for review in the ultrasound unit.  Impression: Technically successful ultrasound guided injection.        Impression and Recommendations:    Assessment and Plan: 49 y.o. male with left knee pain due to DJD.  X-ray today shows severe medial DJD however radiology overread is still pending.  Plan for steroid injection as above.  Can repeat this up to every 3 months.  Also emphasized quad strength and recommend trial of Voltaren gel and compressive knee sleeve.  Spent time discussing that ultimately his knee is likely to effectively wear out requiring a knee replacement.  He is 49 years old and would like to delay total knee replacement a bit if possible.  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  Fortunately he does have short-term disability insurance that he can use if needed.   Orders Placed This Encounter  Procedures  . [place] LIMITED JOINT SPACE STRUCTURES LOW LEFT(NO LINKED CHARGES)    Order Specific Question:   Reason for Exam (SYMPTOM  OR DIAGNOSIS REQUIRED)    Answer:   L knee pain    Order Specific Question:   Preferred imaging location?    Answer:   [facility]  . DG Knee 4 Views W/Patella Left    Standing Status:   Future  Number of Occurrences:   1    Standing Expiration Date:   01/06/2021    Order Specific Question:   Reason for Exam (SYMPTOM  OR DIAGNOSIS REQUIRED)    Answer:   eval knee pain and DJD    Order Specific Question:   Preferred imaging location?    Answer:   [facility]    Order Specific Question:   Radiology Contrast Protocol  - do NOT remove file path    Answer:   \\[email or web address]   No orders of the defined types were placed in this encounter.   Discussed warning signs or symptoms. Please see discharge instructions. Patient expresses understanding.   The above documentation has been reviewed and is accurate and complete [name]

## 2019-11-09 NOTE — Patient Instructions (Signed)
Thank you for coming in today. Call or go to the ER if you develop a large red swollen joint with extreme pain or oozing puss.  Use voltaren gel up to 4 x daily.  Get xray now.  Recheck as needed.

## 2019-11-10 NOTE — Progress Notes (Signed)
X-ray knee does show medium to severe arthritis at the inside part of the knee (medial).  No fractures or other severe arthritis present.

## 2020-04-29 IMAGING — DX DG KNEE COMPLETE 4+V*L*
4 series · 4 of 4 positions shown · non-contrast
Comparison: None

CLINICAL DATA: Chronic LEFT knee pain, no known injury,
degenerative joint disease

EXAM:
LEFT KNEE - COMPLETE 4+ VIEW

[knee ap]
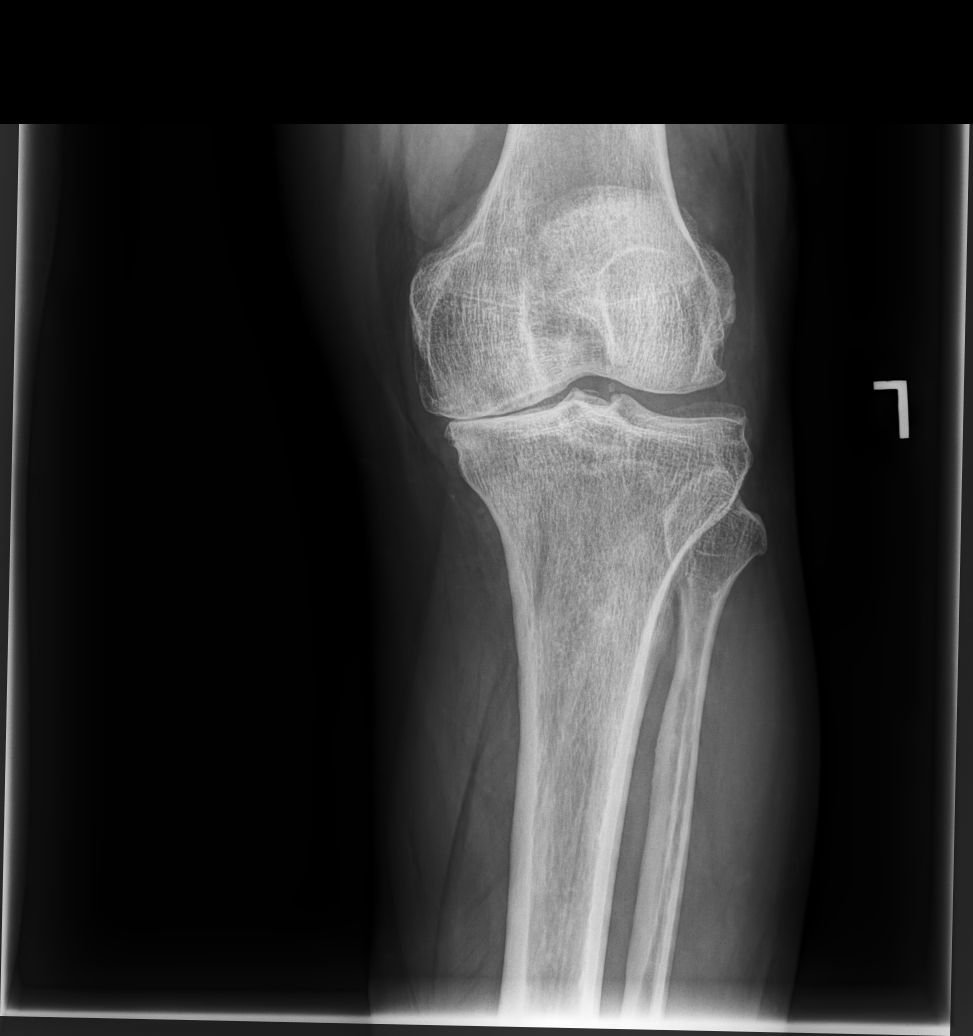

[knee lat]
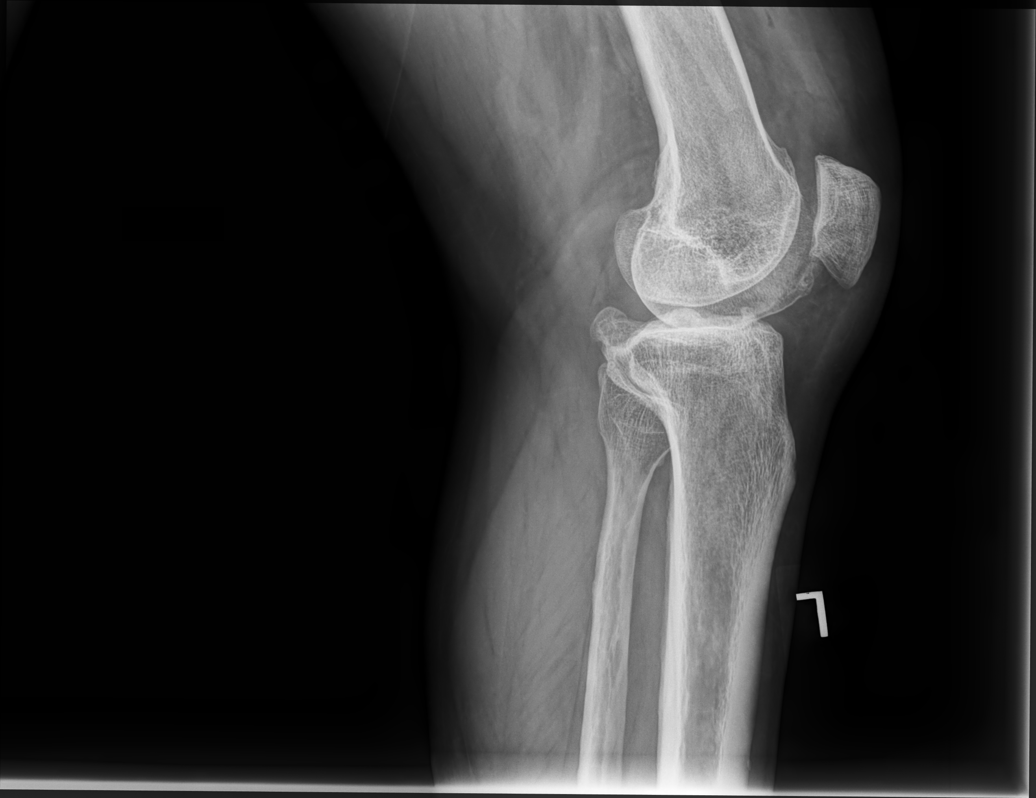

[knee (tunnel view) pa]
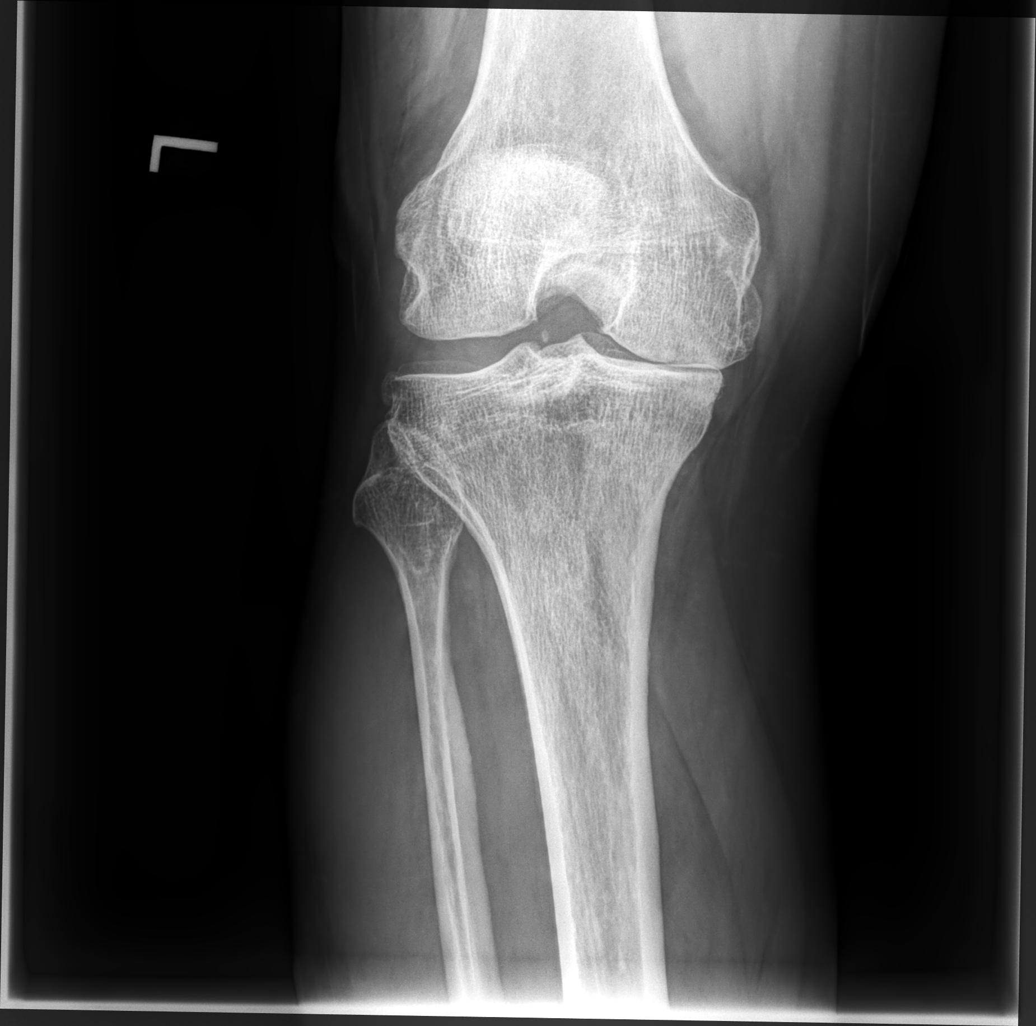

[patella ([facility]) tan]
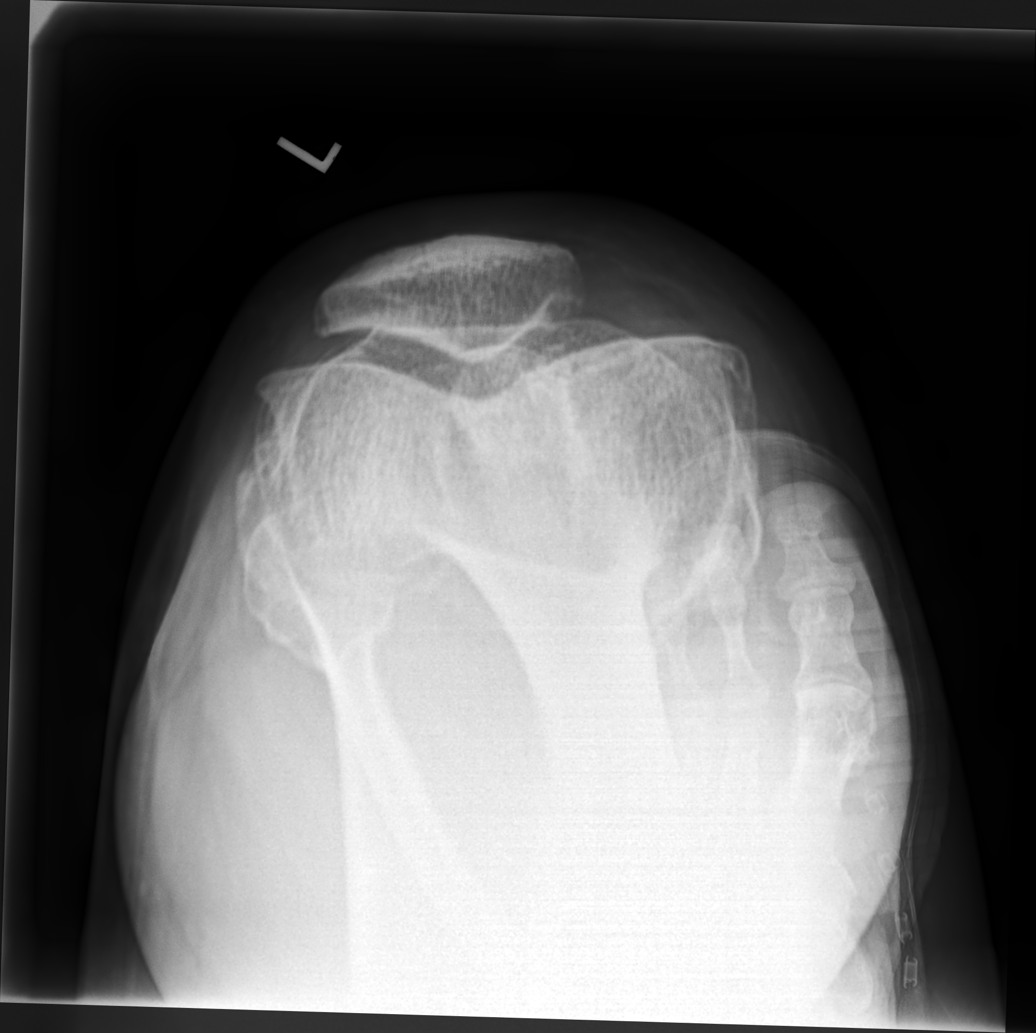

[4 of 4 positions shown; findings below may reference images not displayed]

FINDINGS: Osseous mineralization low normal.

Marked joint space narrowing and mild spur formation at medial
compartment of LEFT knee.

Mild degenerative changes at patellofemoral joint.

Minimal joint effusion.

No acute fracture, dislocation, or bone destruction.
IMPRESSION: Degenerative changes LEFT knee greatest at medial compartment.

## 2020-05-09 DIAGNOSIS — Z6834 Body mass index (BMI) 34.0-34.9, adult: Secondary | ICD-10-CM | POA: Diagnosis not present

## 2020-05-09 DIAGNOSIS — Z20822 Contact with and (suspected) exposure to covid-19: Secondary | ICD-10-CM | POA: Diagnosis not present

## 2020-08-03 ENCOUNTER — Telehealth: Payer: Self-pay | Admitting: Family Medicine

## 2020-08-03 DIAGNOSIS — M1712 Unilateral primary osteoarthritis, left knee: Secondary | ICD-10-CM

## 2020-08-03 DIAGNOSIS — G8929 Other chronic pain: Secondary | ICD-10-CM

## 2020-08-03 NOTE — Telephone Encounter (Signed)
Patient's son called stating that the patient is interested in scheduling his knee surgery. Is there a particular doctor that he should schedule with?  Please advise.

## 2020-08-03 NOTE — Telephone Encounter (Signed)
Plan to refer to [facility] orth[facility] now known as [facility]. Dr. August Saucer, Dr. Magnus Ivan Dr. Roda Shutters would be good choices.  You should hear from their office soon.  Please let me know if you have a problem.

## 2020-08-03 NOTE — Telephone Encounter (Signed)
Called pt's son and relayed Dr. Zollie Pee advice regarding a knee surgeon.  Pt's son verbalizes understanding and asks how he goes about scheduling.  I advise the son that [facility] will call to schedule and verify correct contact info which is [phone].

## 2020-08-08 ENCOUNTER — Encounter: Payer: Self-pay | Admitting: Orthopaedic Surgery

## 2020-08-08 ENCOUNTER — Other Ambulatory Visit: Payer: Self-pay

## 2020-08-08 ENCOUNTER — Ambulatory Visit: Payer: BC Managed Care – PPO | Admitting: Orthopaedic Surgery

## 2020-08-08 VITALS — Ht 69.0 in | Wt 240.0 lb

## 2020-08-08 DIAGNOSIS — M1712 Unilateral primary osteoarthritis, left knee: Secondary | ICD-10-CM

## 2020-08-08 NOTE — Progress Notes (Signed)
Office Visit Note   Patient: [name]           Date of Birth: [birth year]           MRN: [id number] Visit Date: 08/08/2020              Requested by: Rodolph Bong, MD [address] [place],  [place] [address] PCP: Jac Canavan, PA-C   Assessment & Plan: Visit Diagnoses:  1. Primary osteoarthritis of left knee     Plan:  #1: At this time his symptoms are worsening.  We have discussed total joint replacement as well as other conservative measures.  He is already had corticosteroid injections.  He would like to proceed with total joint replacement in the near future.  We have given him a clearance form for his medical doctor.  Once this is returned then we will schedule his surgery.  Follow-Up Instructions: No follow-ups on file.   Orders:  No orders of the defined types were placed in this encounter.  No orders of the defined types were placed in this encounter.     Procedures: No procedures performed   Clinical Data: No additional findings.   Subjective: Chief Complaint  Patient presents with  . Left Knee - Pain   HPI Patient presents today for left knee pain. He said that it has been hurting for a year. His pain is constant, but worse with activity and in the afternoon. He has minimal swelling. His pain is located anteriorly. He said that it does grind and feels weak. He has taken Naproxen for pain relief. He has received a cortisone injection in the past and states that it only helped for a few days.    Review of Systems  All other systems reviewed and are negative.    Objective: Vital Signs: Ht 5\' 9"  (1.753 m)   Wt 240 lb (108.9 kg)   BMI 35.44 kg/m   Physical Exam Constitutional:      Appearance: Normal appearance. He is well-developed.  Eyes:     Pupils: Pupils are equal, round, and reactive to light.  Pulmonary:     Effort: Pulmonary effort is normal.  Skin:    General: Skin is warm and dry.  Neurological:     Mental Status: He  is alert and oriented to person, place, and time.  Psychiatric:        Behavior: Behavior normal.     Ortho Exam  Exam today reveals mild effusion of the knee.  He lacks about 2 to 3 degrees of full extension flexes to about 105 degrees.  He does have patellofemoral crepitance with range of motion.  He does have pseudolaxity with valgus stress.  He does have a mild limp.   Specialty Comments:  No specialty comments available.  Imaging: EXAM: LEFT KNEE - COMPLETE 4+ VIEW  COMPARISON:  None  FINDINGS: Osseous mineralization low normal.  Marked joint space narrowing and mild spur formation at medial compartment of LEFT knee.  Mild degenerative changes at patellofemoral joint.  Minimal joint effusion.  No acute fracture, dislocation, or bone destruction.  IMPRESSION: Degenerative changes LEFT knee greatest at medial compartment. It does appear that he is about 3 to 5 degrees of varus now.    PMFS History: Current Outpatient Medications  Medication Sig Dispense Refill  . meclizine (ANTIVERT) 25 MG tablet Take 1 tablet (25 mg total) by mouth 3 (three) times daily as needed for dizziness. 30 tablet 0  . meloxicam (  MOBIC) 15 MG tablet Take 1 tablet (15 mg total) by mouth daily as needed for pain. 30 tablet 0   No current facility-administered medications for this visit.    Patient Active Problem List   Diagnosis Date Noted  . Primary osteoarthritis of left knee 04/06/2018  . Seasonal allergic rhinitis due to pollen 04/06/2018  . History of Bell's palsy 04/06/2018   History reviewed. No pertinent past medical history.  Family History  Problem Relation Age of Onset  . Hypertension Mother   . Prostate cancer Father   . Diabetes Sister     History reviewed. No pertinent surgical history. Social History   Occupational History  . Not on file  Tobacco Use  . Smoking status: Never Smoker  . Smokeless tobacco: Never Used  Substance and Sexual Activity  .  Alcohol use: No  . Drug use: No  . Sexual activity: Not on file

## 2020-09-13 ENCOUNTER — Other Ambulatory Visit: Payer: Self-pay

## 2020-09-29 ENCOUNTER — Encounter: Payer: Self-pay | Admitting: Medical

## 2020-10-03 NOTE — Patient Instructions (Addendum)
DUE TO COVID-19 ONLY ONE VISITOR IS ALLOWED TO COME WITH YOU AND STAY IN THE WAITING ROOM ONLY DURING PRE OP AND PROCEDURE DAY OF SURGERY. THE 1 VISITOR  MAY VISIT WITH YOU AFTER SURGERY IN YOUR PRIVATE ROOM DURING VISITING HOURS ONLY!  YOU NEED TO HAVE A COVID 19 TEST ON: 10/07/20 @  2:30 PM , THIS TEST MUST BE DONE BEFORE SURGERY,  COVID TESTING SITE [address] [place]  [id number], IT IS ON THE RIGHT GOING OUT [address] APPROXIMATELY  2 MINUTES PAST [employer] ON THE RIGHT. ONCE YOUR COVID TEST IS COMPLETED,  PLEASE BEGIN THE QUARANTINE INSTRUCTIONS AS OUTLINED IN YOUR HANDOUT.                [name]     Your procedure is scheduled on: 10/11/20   Report to [facility] Main  Entrance   Report to short stay at: 5:30 AM     Call this number if you have problems the morning of surgery [phone]    Remember:   NO SOLID FOOD AFTER MIDNIGHT THE NIGHT PRIOR TO SURGERY. NOTHING BY MOUTH EXCEPT CLEAR LIQUIDS UNTIL: 4:30 AM . PLEASE FINISH ENSURE DRINK PER SURGEON ORDER  WHICH NEEDS TO BE COMPLETED AT : 4:30 AM.  CLEAR LIQUID DIET   Foods Allowed                                                                     Foods Excluded  Coffee and tea, regular and decaf                             liquids that you cannot  Plain Jell-O any favor except red or purple                                           see through such as: Fruit ices (not with fruit pulp)                                     milk, soups, orange juice  Iced Popsicles                                    All solid food Carbonated beverages, regular and diet                                    Cranberry, grape and apple juices Sports drinks like Gatorade Lightly seasoned clear broth or consume(fat free) Sugar, honey syrup  Sample Menu Breakfast                                Lunch  Supper Cranberry juice                    Beef broth                             Chicken broth Jell-O                                     Grape juice                           Apple juice Coffee or tea                        Jell-O                                      Popsicle                                                Coffee or tea                        Coffee or tea  _____________________________________________________________________   BRUSH YOUR TEETH MORNING OF SURGERY AND RINSE YOUR MOUTH OUT, NO CHEWING GUM CANDY OR MINTS.                                You may not have any metal on your body including hair pins and              piercings  Do not wear jewelry, lotions, powders or perfumes, deodorant             Men may shave face and neck.   Do not bring valuables to the hospital. [facility] IS NOT             RESPONSIBLE   FOR VALUABLES.  Contacts, dentures or bridgework may not be worn into surgery.  Leave suitcase in the car. After surgery it may be brought to your room.     Patients discharged the day of surgery will not be allowed to drive home. IF YOU ARE HAVING SURGERY AND GOING HOME THE SAME DAY, YOU MUST HAVE AN ADULT TO DRIVE YOU HOME AND BE WITH YOU FOR 24 HOURS. YOU MAY GO HOME BY TAXI OR UBER OR ORTHERWISE, BUT AN ADULT MUST ACCOMPANY YOU HOME AND STAY WITH YOU FOR 24 HOURS.  Name and phone number of your driver:  Special Instructions: N/A              Please read over the following fact sheets you were given: _____________________________________________________________________         [facility] - Preparing for Surgery Before surgery, you can play an important role.  Because skin is not sterile, your skin needs to be as free of germs as possible.  You can reduce the number of germs on your skin by washing with CHG (chlorahexidine gluconate) soap before surgery.  CHG is an antiseptic cleaner which kills germs and bonds with  the skin to continue killing germs even after washing. Please DO NOT use if you have an allergy to CHG or  antibacterial soaps.  If your skin becomes reddened/irritated stop using the CHG and inform your nurse when you arrive at Short Stay. Do not shave (including legs and underarms) for at least 48 hours prior to the first CHG shower.  You may shave your face/neck. Please follow these instructions carefully:  1.  Shower with CHG Soap the night before surgery and the  morning of Surgery.  2.  If you choose to wash your hair, wash your hair first as usual with your  normal  shampoo.  3.  After you shampoo, rinse your hair and body thoroughly to remove the  shampoo.                           4.  Use CHG as you would any other liquid soap.  You can apply chg directly  to the skin and wash                       Gently with a scrungie or clean washcloth.  5.  Apply the CHG Soap to your body ONLY FROM THE NECK DOWN.   Do not use on face/ open                           Wound or open sores. Avoid contact with eyes, ears mouth and genitals (private parts).                       Wash face,  Genitals (private parts) with your normal soap.             6.  Wash thoroughly, paying special attention to the area where your surgery  will be performed.  7.  Thoroughly rinse your body with warm water from the neck down.  8.  DO NOT shower/wash with your normal soap after using and rinsing off  the CHG Soap.                9.  Pat yourself dry with a clean towel.            10.  Wear clean pajamas.            11.  Place clean sheets on your bed the night of your first shower and do not  sleep with pets. Day of Surgery : Do not apply any lotions/deodorants the morning of surgery.  Please wear clean clothes to the hospital/surgery center.  FAILURE TO FOLLOW THESE INSTRUCTIONS MAY RESULT IN THE CANCELLATION OF YOUR SURGERY PATIENT SIGNATURE_________________________________  NURSE SIGNATURE__________________________________  ________________________________________________________________________   [name]  An incentive spirometer is a tool that can help keep your lungs clear and active. This tool measures how well you are filling your lungs with each breath. Taking long deep breaths may help reverse or decrease the chance of developing breathing (pulmonary) problems (especially infection) following:  A long period of time when you are unable to move or be active. BEFORE THE PROCEDURE   If the spirometer includes an indicator to show your best effort, your nurse or respiratory therapist will set it to a desired goal.  If possible, sit up straight or lean slightly forward. Try not to slouch.  Hold the incentive spirometer in an upright position. INSTRUCTIONS FOR  USE  1. Sit on the edge of your bed if possible, or sit up as far as you can in bed or on a chair. 2. Hold the incentive spirometer in an upright position. 3. Breathe out normally. 4. Place the mouthpiece in your mouth and seal your lips tightly around it. 5. Breathe in slowly and as deeply as possible, raising the piston or the ball toward the top of the column. 6. Hold your breath for 3-5 seconds or for as long as possible. Allow the piston or ball to fall to the bottom of the column. 7. Remove the mouthpiece from your mouth and breathe out normally. 8. Rest for a few seconds and repeat Steps 1 through 7 at least 10 times every 1-2 hours when you are awake. Take your time and take a few normal breaths between deep breaths. 9. The spirometer may include an indicator to show your best effort. Use the indicator as a goal to work toward during each repetition. 10. After each set of 10 deep breaths, practice coughing to be sure your lungs are clear. If you have an incision (the cut made at the time of surgery), support your incision when coughing by placing a pillow or rolled up towels firmly against it. Once you are able to get out of bed, walk around indoors and cough well. You may stop using the incentive spirometer when  instructed by your caregiver.  RISKS AND COMPLICATIONS  Take your time so you do not get dizzy or light-headed.  If you are in pain, you may need to take or ask for pain medication before doing incentive spirometry. It is harder to take a deep breath if you are having pain. AFTER USE  Rest and breathe slowly and easily.  It can be helpful to keep track of a log of your progress. Your caregiver can provide you with a simple table to help with this. If you are using the spirometer at home, follow these instructions: [place] IF:   You are having difficultly using the spirometer.  You have trouble using the spirometer as often as instructed.  Your pain medication is not giving enough relief while using the spirometer.  You develop fever of 100.5 F (38.1 C) or higher. SEEK IMMEDIATE MEDICAL CARE IF:   You cough up bloody sputum that had not been present before.  You develop fever of 102 F (38.9 C) or greater.  You develop worsening pain at or near the incision site. MAKE SURE YOU:   Understand these instructions.  Will watch your condition.  Will get help right away if you are not doing well or get worse. Document Released: 01/14/2007 Document Revised: 11/26/2011 Document Reviewed: 03/17/2007 [facility] Patient Information 2014 [place], [place].   ________________________________________________________________________

## 2020-10-04 ENCOUNTER — Encounter (HOSPITAL_COMMUNITY): Payer: Self-pay

## 2020-10-04 ENCOUNTER — Other Ambulatory Visit: Payer: Self-pay

## 2020-10-04 ENCOUNTER — Encounter (HOSPITAL_COMMUNITY)
Admission: RE | Admit: 2020-10-04 | Discharge: 2020-10-04 | Disposition: A | Discharge status: Home / Self Care | Payer: BC Managed Care – PPO | Source: Ambulatory Visit | Attending: Orthopaedic Surgery | Admitting: Orthopaedic Surgery

## 2020-10-04 ENCOUNTER — Ambulatory Visit (HOSPITAL_COMMUNITY)
Admission: RE | Admit: 2020-10-04 | Discharge: 2020-10-04 | Disposition: A | Discharge status: Home / Self Care | Payer: BC Managed Care – PPO | Source: Ambulatory Visit | Attending: Orthopedic Surgery | Admitting: Orthopedic Surgery

## 2020-10-04 DIAGNOSIS — Z01818 Encounter for other preprocedural examination: Secondary | ICD-10-CM | POA: Insufficient documentation

## 2020-10-04 HISTORY — DX: Unspecified osteoarthritis, unspecified site: M19.90

## 2020-10-04 LAB — COMPREHENSIVE METABOLIC PANEL
ALT: 32 U/L (ref 0–44)
AST: 26 U/L (ref 15–41)
Albumin: 4 g/dL (ref 3.5–5.0)
Alkaline Phosphatase: 83 U/L (ref 38–126)
Anion gap: 6 (ref 5–15)
BUN: 18 mg/dL (ref 6–20)
CO2: 25 mmol/L (ref 22–32)
Calcium: 9.2 mg/dL (ref 8.9–10.3)
Chloride: 107 mmol/L (ref 98–111)
Creatinine, Ser: 0.96 mg/dL (ref 0.61–1.24)
GFR, Estimated: >60 mL/min (ref >60)
Glucose, Bld: 107 mg/dL — ABNORMAL HIGH (ref 70–99)
Potassium: 4.2 mmol/L (ref 3.5–5.1)
Sodium: 138 mmol/L (ref 135–145)
Total Bilirubin: 0.6 mg/dL (ref 0.3–1.2)
Total Protein: 7.3 g/dL (ref 6.5–8.1)

## 2020-10-04 LAB — URINALYSIS, ROUTINE W REFLEX MICROSCOPIC
Bilirubin Urine: NEGATIVE
Glucose, UA: NEGATIVE mg/dL
Hgb urine dipstick: NEGATIVE
Ketones, ur: NEGATIVE mg/dL
Leukocytes,Ua: NEGATIVE
Nitrite: NEGATIVE
Protein, ur: NEGATIVE mg/dL
Specific Gravity, Urine: 1.02 (ref 1.005–1.030)
pH: 6 (ref 5.0–8.0)

## 2020-10-04 LAB — CBC WITH DIFFERENTIAL/PLATELET
Abs Immature Granulocytes: 0.04 10*3/uL (ref 0.00–0.07)
Basophils Absolute: 0 10*3/uL (ref 0.0–0.1)
Basophils Relative: 0 %
Eosinophils Absolute: 0.1 10*3/uL (ref 0.0–0.5)
Eosinophils Relative: 2 %
HCT: 49 % (ref 39.0–52.0)
Hemoglobin: 16.5 g/dL (ref 13.0–17.0)
Immature Granulocytes: 1 %
Lymphocytes Relative: 24 %
Lymphs Abs: 1.7 10*3/uL (ref 0.7–4.0)
MCH: 29.6 pg (ref 26.0–34.0)
MCHC: 33.7 g/dL (ref 30.0–36.0)
MCV: 87.8 fL (ref 80.0–100.0)
Monocytes Absolute: 0.4 10*3/uL (ref 0.1–1.0)
Monocytes Relative: 6 %
Neutro Abs: 4.6 10*3/uL (ref 1.7–7.7)
Neutrophils Relative %: 67 %
Platelets: 240 10*3/uL (ref 150–400)
RBC: 5.58 MIL/uL (ref 4.22–5.81)
RDW: 12.5 % (ref 11.5–15.5)
WBC: 6.9 10*3/uL (ref 4.0–10.5)
nRBC: 0 % (ref 0.0–0.2)

## 2020-10-04 LAB — SURGICAL PCR SCREEN
MRSA, PCR: NEGATIVE
Staphylococcus aureus: NEGATIVE

## 2020-10-04 NOTE — Progress Notes (Signed)
COVID Vaccine Completed: NO Date COVID Vaccine completed: COVID vaccine manufacturer: Pfizer    [facility]   PCP - [facility] Cardiologist -   Chest x-ray -  EKG -  Stress Test -  ECHO -  Cardiac Cath -  Pacemaker/ICD device last checked:  Sleep Study -  CPAP -   Fasting Blood Sugar -  Checks Blood Sugar _____ times a day  Blood Thinner Instructions: Aspirin Instructions: Last Dose:  Anesthesia review:   Patient denies shortness of breath, fever, cough and chest pain at PAT appointment   Patient verbalized understanding of instructions that were given to them at the PAT appointment. Patient was also instructed that they will need to review over the PAT instructions again at home before surgery.

## 2020-10-05 LAB — URINE CULTURE: Culture: NO GROWTH

## 2020-10-07 ENCOUNTER — Other Ambulatory Visit (HOSPITAL_COMMUNITY): Payer: BC Managed Care – PPO

## 2020-10-11 ENCOUNTER — Encounter (HOSPITAL_COMMUNITY): Admission: RE | Payer: Self-pay | Source: Home / Self Care

## 2020-10-11 ENCOUNTER — Telehealth: Payer: Self-pay | Admitting: Orthopaedic Surgery

## 2020-10-11 ENCOUNTER — Ambulatory Visit (HOSPITAL_COMMUNITY)
Admission: RE | Admit: 2020-10-11 | Payer: BC Managed Care – PPO | Source: Home / Self Care | Admitting: Orthopaedic Surgery

## 2020-10-11 ENCOUNTER — Other Ambulatory Visit: Payer: Self-pay

## 2020-10-11 LAB — TYPE AND SCREEN
ABO/RH(D): O POS
Antibody Screen: NEGATIVE

## 2020-10-11 SURGERY — ARTHROPLASTY, KNEE, TOTAL
Anesthesia: Spinal | Site: Knee | Laterality: Left

## 2020-10-11 NOTE — Telephone Encounter (Signed)
Pt son called and said they showed up to go to surgery and they said that the surgery had been cancelled at the hospital. They would like to know why it got cancelled and what are they suppose to do? They wanna know if its gonna be rescheduled [phone].

## 2020-10-12 NOTE — Patient Instructions (Addendum)
DUE TO COVID-19 ONLY ONE VISITOR IS ALLOWED TO COME WITH YOU AND STAY IN THE WAITING ROOM ONLY DURING PRE OP AND PROCEDURE.   IF YOU WILL BE ADMITTED INTO THE HOSPITAL YOU ARE ALLOWED ONE SUPPORT PERSON DURING VISITATION HOURS ONLY (10AM -8PM)   . The support person may change daily. . The support person must pass our screening, gel in and out, and wear a mask at all times, including in the patient's room. . Patients must also wear a mask when staff or their support person are in the room.   COVID SWAB TESTING MUST BE COMPLETED ON:  Friday, Feb. 4, 2022 at 11:30AM   [address] [place], [place] [address]  (Must self quarantine after testing. Follow instructions on handout.)       Your procedure is scheduled on: Tuesday, Feb. 8, 2022   Report to [facility] Main  Entrance    Report to admitting at 7:45 AM   Call this number if you have problems the morning of surgery [phone]   Do not eat food :After Midnight.   May have liquids until 7:15 AM   day of surgery  CLEAR LIQUID DIET  Foods Allowed                                                                     Foods Excluded  Water, Black Coffee and tea, regular and decaf                             liquids that you cannot  Plain Jell-O in any flavor  (No red)                                           see through such as: Fruit ices (not with fruit pulp)                                     milk, soups, orange juice              Iced Popsicles (No red)                                    All solid food                                   Apple juices Sports drinks like Gatorade (No red) Lightly seasoned clear broth or consume(fat free) Sugar, honey syrup  Sample Menu Breakfast                                Lunch                                     Supper Cranberry  juice                    Beef broth                            Chicken broth Jell-O                                     Grape juice                            Apple juice Coffee or tea                        Jell-O                                      Popsicle                                                Coffee or tea                        Coffee or tea          1. The day of surgery:  ? Drink ONE (1) Pre-Surgery Clear Ensure at 7:15 AM the morning of surgery. Drink in one sitting. Do not sip.  ? This drink was given to you during your hospital  pre-op appointment visit. ? Nothing else to drink after completing the  Pre-Surgery Clear Ensure.          If you have questions, please contact your surgeon's office.     Oral Hygiene is also important to reduce your risk of infection.                                    Remember - BRUSH YOUR TEETH THE MORNING OF SURGERY WITH YOUR REGULAR TOOTHPASTE   Do NOT smoke after Midnight   Take these medicines the morning of surgery with A SIP OF WATER: None                               You may not have any metal on your body including jewelry, and body piercings             Do not wear lotions, powders, perfumes/cologne, or deodorant                          Men may shave face and neck.   Do not bring valuables to the hospital. [facility] IS NOT             RESPONSIBLE   FOR VALUABLES.   Contacts, dentures or bridgework may not be worn into surgery.   Bring small overnight bag day of surgery.    Patients discharged the day of surgery will not be allowed to drive home.   Special Instructions: Bring a copy of your healthcare power of  attorney and living will documents         the day of surgery if you haven't scanned them in before.              Please read over the following fact sheets you were given: IF YOU HAVE QUESTIONS ABOUT YOUR PRE OP INSTRUCTIONS PLEASE CALL [phone]   [facility] - Preparing for Surgery Before surgery, you can play an important role.  Because skin is not sterile, your skin needs to be as free of germs as possible.  You can reduce the number of germs on your  skin by washing with CHG (chlorahexidine gluconate) soap before surgery.  CHG is an antiseptic cleaner which kills germs and bonds with the skin to continue killing germs even after washing. Please DO NOT use if you have an allergy to CHG or antibacterial soaps.  If your skin becomes reddened/irritated stop using the CHG and inform your nurse when you arrive at Short Stay. Do not shave (including legs and underarms) for at least 48 hours prior to the first CHG shower.  You may shave your face/neck.  Please follow these instructions carefully:  1.  Shower with CHG Soap the night before surgery and the  morning of surgery.  2.  If you choose to wash your hair, wash your hair first as usual with your normal  shampoo.  3.  After you shampoo, rinse your hair and body thoroughly to remove the shampoo.                             4.  Use CHG as you would any other liquid soap.  You can apply chg directly to the skin and wash.  Gently with a scrungie or clean washcloth.  5.  Apply the CHG Soap to your body ONLY FROM THE NECK DOWN.   Do   not use on face/ open                           Wound or open sores. Avoid contact with eyes, ears mouth and   genitals (private parts).                       Wash face,  Genitals (private parts) with your normal soap.             6.  Wash thoroughly, paying special attention to the area where your    surgery  will be performed.  7.  Thoroughly rinse your body with warm water from the neck down.  8.  DO NOT shower/wash with your normal soap after using and rinsing off the CHG Soap.                9.  Pat yourself dry with a clean towel.            10.  Wear clean pajamas.            11.  Place clean sheets on your bed the night of your first shower and do not  sleep with pets. Day of Surgery : Do not apply any lotions/deodorants the morning of surgery.  Please wear clean clothes to the hospital/surgery center.  FAILURE TO FOLLOW THESE INSTRUCTIONS MAY RESULT IN THE  CANCELLATION OF YOUR SURGERY  PATIENT SIGNATURE_________________________________  NURSE SIGNATURE__________________________________  ________________________________________________________________________   Rogelia MireIncentive Spirometer  An incentive spirometer is a  tool that can help keep your lungs clear and active. This tool measures how well you are filling your lungs with each breath. Taking long deep breaths may help reverse or decrease the chance of developing breathing (pulmonary) problems (especially infection) following:  A long period of time when you are unable to move or be active. BEFORE THE PROCEDURE   If the spirometer includes an indicator to show your best effort, your nurse or respiratory therapist will set it to a desired goal.  If possible, sit up straight or lean slightly forward. Try not to slouch.  Hold the incentive spirometer in an upright position. INSTRUCTIONS FOR USE  1. Sit on the edge of your bed if possible, or sit up as far as you can in bed or on a chair. 2. Hold the incentive spirometer in an upright position. 3. Breathe out normally. 4. Place the mouthpiece in your mouth and seal your lips tightly around it. 5. Breathe in slowly and as deeply as possible, raising the piston or the ball toward the top of the column. 6. Hold your breath for 3-5 seconds or for as long as possible. Allow the piston or ball to fall to the bottom of the column. 7. Remove the mouthpiece from your mouth and breathe out normally. 8. Rest for a few seconds and repeat Steps 1 through 7 at least 10 times every 1-2 hours when you are awake. Take your time and take a few normal breaths between deep breaths. 9. The spirometer may include an indicator to show your best effort. Use the indicator as a goal to work toward during each repetition. 10. After each set of 10 deep breaths, practice coughing to be sure your lungs are clear. If you have an incision (the cut made at the time of  surgery), support your incision when coughing by placing a pillow or rolled up towels firmly against it. Once you are able to get out of bed, walk around indoors and cough well. You may stop using the incentive spirometer when instructed by your caregiver.  RISKS AND COMPLICATIONS  Take your time so you do not get dizzy or light-headed.  If you are in pain, you may need to take or ask for pain medication before doing incentive spirometry. It is harder to take a deep breath if you are having pain. AFTER USE  Rest and breathe slowly and easily.  It can be helpful to keep track of a log of your progress. Your caregiver can provide you with a simple table to help with this. If you are using the spirometer at home, follow these instructions: SEEK MEDICAL CARE IF:   You are having difficultly using the spirometer.  You have trouble using the spirometer as often as instructed.  Your pain medication is not giving enough relief while using the spirometer.  You develop fever of 100.5 F (38.1 C) or higher. SEEK IMMEDIATE MEDICAL CARE IF:   You cough up bloody sputum that had not been present before.  You develop fever of 102 F (38.9 C) or greater.  You develop worsening pain at or near the incision site. MAKE SURE YOU:   Understand these instructions.  Will watch your condition.  Will get help right away if you are not doing well or get worse. Document Released: 01/14/2007 Document Revised: 11/26/2011 Document Reviewed: 03/17/2007 [employer] Patient Information 2014 [employer], [place].   ________________________________________________________________________  WHAT IS A BLOOD TRANSFUSION? Blood Transfusion Information  A transfusion is the replacement of  blood or some of its parts. Blood is made up of multiple cells which provide different functions.  Red blood cells carry oxygen and are used for blood loss replacement.  White blood cells fight against infection.  Platelets  control bleeding.  Plasma helps clot blood.  Other blood products are available for specialized needs, such as hemophilia or other clotting disorders. BEFORE THE TRANSFUSION  Who gives blood for transfusions?   Healthy volunteers who are fully evaluated to make sure their blood is safe. This is blood bank blood. Transfusion therapy is the safest it has ever been in the practice of medicine. Before blood is taken from a donor, a complete history is taken to make sure that person has no history of diseases nor engages in risky social behavior (examples are intravenous drug use or sexual activity with multiple partners). The donor's travel history is screened to minimize risk of transmitting infections, such as malaria. The donated blood is tested for signs of infectious diseases, such as HIV and hepatitis. The blood is then tested to be sure it is compatible with you in order to minimize the chance of a transfusion reaction. If you or a relative donates blood, this is often done in anticipation of surgery and is not appropriate for emergency situations. It takes many days to process the donated blood. RISKS AND COMPLICATIONS Although transfusion therapy is very safe and saves many lives, the main dangers of transfusion include:   Getting an infectious disease.  Developing a transfusion reaction. This is an allergic reaction to something in the blood you were given. Every precaution is taken to prevent this. The decision to have a blood transfusion has been considered carefully by your caregiver before blood is given. Blood is not given unless the benefits outweigh the risks. AFTER THE TRANSFUSION  Right after receiving a blood transfusion, you will usually feel much better and more energetic. This is especially true if your red blood cells have gotten low (anemic). The transfusion raises the level of the red blood cells which carry oxygen, and this usually causes an energy increase.  The nurse  administering the transfusion will monitor you carefully for complications. HOME CARE INSTRUCTIONS  No special instructions are needed after a transfusion. You may find your energy is better. Speak with your caregiver about any limitations on activity for underlying diseases you may have. SEEK MEDICAL CARE IF:   Your condition is not improving after your transfusion.  You develop redness or irritation at the intravenous (IV) site. SEEK IMMEDIATE MEDICAL CARE IF:  Any of the following symptoms occur over the next 12 hours:  Shaking chills.  You have a temperature by mouth above 102 F (38.9 C), not controlled by medicine.  Chest, back, or muscle pain.  People around you feel you are not acting correctly or are confused.  Shortness of breath or difficulty breathing.  Dizziness and fainting.  You get a rash or develop hives.  You have a decrease in urine output.  Your urine turns a dark color or changes to pink, red, or brown. Any of the following symptoms occur over the next 10 days:  You have a temperature by mouth above 102 F (38.9 C), not controlled by medicine.  Shortness of breath.  Weakness after normal activity.  The white part of the eye turns yellow (jaundice).  You have a decrease in the amount of urine or are urinating less often.  Your urine turns a dark color or changes to pink, red,  or brown. Document Released: 08/31/2000 Document Revised: 11/26/2011 Document Reviewed: 04/19/2008 [facility] Patient Information 2014 [place], [place].  _______________________________________________________________________

## 2020-10-12 NOTE — H&P (Addendum)
TOTAL KNEE ADMISSION H&P  Patient is being admitted for left total knee arthroplasty.  Subjective:  Chief Complaint:left knee pain.  HPI: [name], 50 y.o. male, has a history of pain and functional disability in the left knee due to arthritis and has failed non-surgical conservative treatments for greater than 12 weeks to includeNSAID's and/or analgesics, corticosteriod injections, viscosupplementation injections, flexibility and strengthening excercises, use of assistive devices and activity modification.  Onset of symptoms was gradual, starting 5 years ago with gradually worsening course since that time. The patient noted no past surgery on the left knee(s).  Patient currently rates pain in the left knee(s) at 7 out of 10 with activity. Patient has night pain, worsening of pain with activity and weight bearing, pain that interferes with activities of daily living, crepitus and joint swelling.  Patient has evidence of subchondral cysts, subchondral sclerosis, periarticular osteophytes, joint subluxation and joint space narrowing by imaging studies.  There is no active infection.  Patient Active Problem List   Diagnosis Date Noted  . Primary osteoarthritis of left knee 04/06/2018  . Seasonal allergic rhinitis due to pollen 04/06/2018  . History of Bell's palsy 04/06/2018   Past Medical History:  Diagnosis Date  . Arthritis     Past Surgical History:  Procedure Laterality Date  . NO PAST SURGERIES      No current facility-administered medications for this encounter.   Current Outpatient Medications  Medication Sig Dispense Refill Last Dose  . Multiple Vitamin (MULTIVITAMIN WITH MINERALS) TABS tablet Take 1 tablet by mouth daily.     . naproxen sodium (ALEVE) 220 MG tablet Take 220 mg by mouth daily as needed (pain).      No Known Allergies  Social History   Tobacco Use  . Smoking status: Never Smoker  . Smokeless tobacco: Never Used  Substance Use Topics  . Alcohol use:  No    Family History  Problem Relation Age of Onset  . Hypertension Mother   . Prostate cancer Father   . Diabetes Sister      Review of Systems  Constitutional: Negative.   HENT: Negative.   Eyes: Negative.   Respiratory: Negative.   Cardiovascular: Negative.   Gastrointestinal: Negative.   Genitourinary: Negative.   Musculoskeletal: Positive for arthralgias and joint swelling.  Neurological: Negative.   Hematological: Negative.   Psychiatric/Behavioral: Negative.     Objective:  Physical Exam Constitutional:      Appearance: Normal appearance. He is obese.  HENT:     Head: Normocephalic.     Nose: Nose normal.     Mouth/Throat:     Mouth: Mucous membranes are moist.  Eyes:     Extraocular Movements: Extraocular movements intact.     Conjunctiva/sclera: Conjunctivae normal.     Pupils: Pupils are equal, round, and reactive to light.  Cardiovascular:     Rate and Rhythm: Normal rate and regular rhythm.     Pulses: Normal pulses.     Heart sounds: Normal heart sounds.  Pulmonary:     Effort: Pulmonary effort is normal.     Breath sounds: Normal breath sounds.  Abdominal:     General: Bowel sounds are normal.     Palpations: Abdomen is soft.  Skin:    General: Skin is warm and dry.  Neurological:     General: No focal deficit present.     Mental Status: He is alert and oriented to person, place, and time.  Psychiatric:  Mood and Affect: Mood normal.        Behavior: Behavior normal.     Vital signs in last 24 hours: Pulse Rate:  [90] 90 (01/27 1531) BP: (134)/(90) 134/90 (01/27 1531)  Labs:   Estimated body mass index is 34.72 kg/m as calculated from the following:   Height as of 10/04/20: 5\' 10"  (1.778 m).   Weight as of 10/04/20: 109.8 kg.   Imaging Review Plain radiographs demonstrate severe degenerative joint disease of the left knee(s). The overall alignment issignificant varus. The bone quality appears to be good for age and reported  activity level.      Assessment/Plan:  End stage arthritis, left knee   The patient history, physical examination, clinical judgment of the provider and imaging studies are consistent with end stage degenerative joint disease of the left knee(s) and total knee arthroplasty is deemed medically necessary. The treatment options including medical management, injection therapy arthroscopy and arthroplasty were discussed at length. The risks and benefits of total knee arthroplasty were presented and reviewed. The risks due to aseptic loosening, infection, stiffness, patella tracking problems, thromboembolic complications and other imponderables were discussed. The patient acknowledged the explanation, agreed to proceed with the plan and consent was signed. Patient is being admitted for inpatient treatment for surgery, pain control, PT, OT, prophylactic antibiotics, VTE prophylaxis, progressive ambulation and ADL's and discharge planning. The patient is planning to be discharged home with home health services     Patient's anticipated LOS is less than 2 midnights, meeting these requirements: - Younger than 75 - Lives within 1 hour of care - Has a competent adult at home to recover with post-op recover - NO history of  - Chronic pain requiring opiods  - Diabetes  - Coronary Artery Disease  - Heart failure  - Heart attack  - Stroke  - DVT/VTE  - Cardiac arrhythmia  - Respiratory Failure/COPD  - Renal failure  - Anemia  - Advanced Liver disease  75. [name]  10/13/2020 4:11 PM

## 2020-10-13 ENCOUNTER — Other Ambulatory Visit: Payer: Self-pay

## 2020-10-13 ENCOUNTER — Encounter: Payer: Self-pay | Admitting: Orthopaedic Surgery

## 2020-10-13 ENCOUNTER — Ambulatory Visit (INDEPENDENT_AMBULATORY_CARE_PROVIDER_SITE_OTHER): Payer: BC Managed Care – PPO | Admitting: Orthopaedic Surgery

## 2020-10-13 VITALS — BP 134/90 | HR 90

## 2020-10-13 DIAGNOSIS — M1712 Unilateral primary osteoarthritis, left knee: Secondary | ICD-10-CM | POA: Diagnosis not present

## 2020-10-21 ENCOUNTER — Encounter (HOSPITAL_COMMUNITY)
Admission: RE | Admit: 2020-10-21 | Discharge: 2020-10-21 | Disposition: A | Discharge status: Home / Self Care | Payer: BC Managed Care – PPO | Source: Ambulatory Visit | Attending: Orthopaedic Surgery | Admitting: Orthopaedic Surgery

## 2020-10-21 ENCOUNTER — Other Ambulatory Visit (HOSPITAL_COMMUNITY)
Admission: RE | Admit: 2020-10-21 | Discharge: 2020-10-21 | Disposition: A | Discharge status: Home / Self Care | Payer: BC Managed Care – PPO | Source: Ambulatory Visit | Attending: Orthopaedic Surgery | Admitting: Orthopaedic Surgery

## 2020-10-21 ENCOUNTER — Other Ambulatory Visit (HOSPITAL_COMMUNITY): Payer: BC Managed Care – PPO

## 2020-10-21 ENCOUNTER — Other Ambulatory Visit: Payer: Self-pay

## 2020-10-21 DIAGNOSIS — Z20822 Contact with and (suspected) exposure to covid-19: Secondary | ICD-10-CM | POA: Diagnosis not present

## 2020-10-21 DIAGNOSIS — Z01812 Encounter for preprocedural laboratory examination: Secondary | ICD-10-CM | POA: Diagnosis present

## 2020-10-21 LAB — CBC WITH DIFFERENTIAL/PLATELET
Abs Immature Granulocytes: 0.04 10*3/uL (ref 0.00–0.07)
Basophils Absolute: 0 10*3/uL (ref 0.0–0.1)
Basophils Relative: 0 %
Eosinophils Absolute: 0.1 10*3/uL (ref 0.0–0.5)
Eosinophils Relative: 1 %
HCT: 47.4 % (ref 39.0–52.0)
Hemoglobin: 16.2 g/dL (ref 13.0–17.0)
Immature Granulocytes: 1 %
Lymphocytes Relative: 25 %
Lymphs Abs: 1.6 10*3/uL (ref 0.7–4.0)
MCH: 29.5 pg (ref 26.0–34.0)
MCHC: 34.2 g/dL (ref 30.0–36.0)
MCV: 86.2 fL (ref 80.0–100.0)
Monocytes Absolute: 0.5 10*3/uL (ref 0.1–1.0)
Monocytes Relative: 7 %
Neutro Abs: 4.5 10*3/uL (ref 1.7–7.7)
Neutrophils Relative %: 66 %
Platelets: 258 10*3/uL (ref 150–400)
RBC: 5.5 MIL/uL (ref 4.22–5.81)
RDW: 12.5 % (ref 11.5–15.5)
WBC: 6.7 10*3/uL (ref 4.0–10.5)
nRBC: 0 % (ref 0.0–0.2)

## 2020-10-21 LAB — URINALYSIS, ROUTINE W REFLEX MICROSCOPIC
Bilirubin Urine: NEGATIVE
Glucose, UA: NEGATIVE mg/dL
Hgb urine dipstick: NEGATIVE
Ketones, ur: NEGATIVE mg/dL
Leukocytes,Ua: NEGATIVE
Nitrite: NEGATIVE
Protein, ur: NEGATIVE mg/dL
Specific Gravity, Urine: 1.011 (ref 1.005–1.030)
pH: 8 (ref 5.0–8.0)

## 2020-10-21 LAB — COMPREHENSIVE METABOLIC PANEL
ALT: 40 U/L (ref 0–44)
AST: 30 U/L (ref 15–41)
Albumin: 4.2 g/dL (ref 3.5–5.0)
Alkaline Phosphatase: 83 U/L (ref 38–126)
Anion gap: 9 (ref 5–15)
BUN: 14 mg/dL (ref 6–20)
CO2: 22 mmol/L (ref 22–32)
Calcium: 9.3 mg/dL (ref 8.9–10.3)
Chloride: 106 mmol/L (ref 98–111)
Creatinine, Ser: 0.99 mg/dL (ref 0.61–1.24)
GFR, Estimated: >60 mL/min (ref >60)
Glucose, Bld: 106 mg/dL — ABNORMAL HIGH (ref 70–99)
Potassium: 3.9 mmol/L (ref 3.5–5.1)
Sodium: 137 mmol/L (ref 135–145)
Total Bilirubin: 1 mg/dL (ref 0.3–1.2)
Total Protein: 7.9 g/dL (ref 6.5–8.1)

## 2020-10-22 LAB — URINE CULTURE: Culture: NO GROWTH

## 2020-10-22 LAB — SARS CORONAVIRUS 2 (TAT 6-24 HRS): SARS Coronavirus 2: NEGATIVE

## 2020-10-25 ENCOUNTER — Ambulatory Visit (HOSPITAL_COMMUNITY): Payer: BC Managed Care – PPO | Admitting: Certified Registered Nurse Anesthetist

## 2020-10-25 ENCOUNTER — Observation Stay (HOSPITAL_COMMUNITY)
Admission: RE | Admit: 2020-10-25 | Discharge: 2020-10-26 | Disposition: A | Discharge status: Home / Self Care | Payer: BC Managed Care – PPO | Attending: Orthopaedic Surgery | Admitting: Orthopaedic Surgery

## 2020-10-25 ENCOUNTER — Encounter (HOSPITAL_COMMUNITY): Payer: Self-pay | Admitting: Orthopaedic Surgery

## 2020-10-25 ENCOUNTER — Encounter (HOSPITAL_COMMUNITY)
Admission: RE | Disposition: A | Discharge status: Home / Self Care | Payer: Self-pay | Source: Home / Self Care | Attending: Orthopaedic Surgery

## 2020-10-25 DIAGNOSIS — M1712 Unilateral primary osteoarthritis, left knee: Secondary | ICD-10-CM | POA: Diagnosis not present

## 2020-10-25 DIAGNOSIS — Z96652 Presence of left artificial knee joint: Secondary | ICD-10-CM

## 2020-10-25 DIAGNOSIS — Z96643 Presence of artificial hip joint, bilateral: Secondary | ICD-10-CM

## 2020-10-25 DIAGNOSIS — R42 Dizziness and giddiness: Secondary | ICD-10-CM

## 2020-10-25 HISTORY — PX: TOTAL KNEE ARTHROPLASTY: SHX125

## 2020-10-25 LAB — TYPE AND SCREEN
ABO/RH(D): O POS
Antibody Screen: NEGATIVE

## 2020-10-25 SURGERY — ARTHROPLASTY, KNEE, TOTAL
Anesthesia: Spinal | Site: Knee | Laterality: Left

## 2020-10-25 MED ORDER — METHOCARBAMOL 500 MG PO TABS: 500.0000 mg | ORAL_TABLET | Freq: Four times a day (QID) | ORAL | Status: DC | PRN

## 2020-10-25 MED ORDER — PROPOFOL 10 MG/ML IV BOLUS
INTRAVENOUS | Status: DC | PRN
Start: 2020-10-25 — End: 2020-10-25
  Administered 2020-10-25: 30 mg via INTRAVENOUS

## 2020-10-25 MED ORDER — CEFAZOLIN SODIUM-DEXTROSE 2-4 GM/100ML-% IV SOLN
2.0000 g | INTRAVENOUS | Status: AC
  Administered 2020-10-25: 2 g via INTRAVENOUS
  Filled 2020-10-25: qty 100

## 2020-10-25 MED ORDER — OXYCODONE HCL 5 MG PO TABS
5.0000 mg | ORAL_TABLET | ORAL | Status: DC | PRN
  Administered 2020-10-26 (×3): 5 mg via ORAL
  Filled 2020-10-25 (×3): qty 1

## 2020-10-25 MED ORDER — ASPIRIN 81 MG PO CHEW
81.0000 mg | CHEWABLE_TABLET | Freq: Two times a day (BID) | ORAL | Status: DC
  Administered 2020-10-25 – 2020-10-26 (×2): 81 mg via ORAL
  Filled 2020-10-25 (×2): qty 1

## 2020-10-25 MED ORDER — MEPERIDINE HCL 50 MG/ML IJ SOLN: 6.2500 mg | INTRAMUSCULAR | Status: DC | PRN

## 2020-10-25 MED ORDER — ACETAMINOPHEN 500 MG PO TABS
1000.0000 mg | ORAL_TABLET | Freq: Once | ORAL | Status: AC
  Administered 2020-10-25: 1000 mg via ORAL
  Filled 2020-10-25: qty 2

## 2020-10-25 MED ORDER — ORAL CARE MOUTH RINSE: 15.0000 mL | Freq: Once | OROMUCOSAL | Status: AC

## 2020-10-25 MED ORDER — PROPOFOL 1000 MG/100ML IV EMUL
INTRAVENOUS | Status: AC
  Filled 2020-10-25: qty 100

## 2020-10-25 MED ORDER — ONDANSETRON HCL 4 MG/2ML IJ SOLN: 4.0000 mg | Freq: Once | INTRAMUSCULAR | Status: DC | PRN

## 2020-10-25 MED ORDER — ASPIRIN 81 MG PO CHEW: 81.0000 mg | CHEWABLE_TABLET | Freq: Every day | ORAL | 0 refills | Status: DC

## 2020-10-25 MED ORDER — LACTATED RINGERS IV BOLUS
500.0000 mL | Freq: Once | INTRAVENOUS | Status: AC
  Administered 2020-10-25: 500 mL via INTRAVENOUS

## 2020-10-25 MED ORDER — CEFAZOLIN SODIUM-DEXTROSE 2-4 GM/100ML-% IV SOLN
2.0000 g | Freq: Three times a day (TID) | INTRAVENOUS | Status: AC
  Administered 2020-10-25 – 2020-10-26 (×2): 2 g via INTRAVENOUS
  Filled 2020-10-25 (×2): qty 100

## 2020-10-25 MED ORDER — PHENYLEPHRINE 40 MCG/ML (10ML) SYRINGE FOR IV PUSH (FOR BLOOD PRESSURE SUPPORT)
PREFILLED_SYRINGE | INTRAVENOUS | Status: DC | PRN
  Administered 2020-10-25 (×3): 80 ug via INTRAVENOUS

## 2020-10-25 MED ORDER — 0.9 % SODIUM CHLORIDE (POUR BTL) OPTIME
TOPICAL | Status: DC | PRN
  Administered 2020-10-25: 1000 mL

## 2020-10-25 MED ORDER — PROPOFOL 500 MG/50ML IV EMUL
INTRAVENOUS | Status: DC | PRN
  Administered 2020-10-25: 75 ug/kg/min via INTRAVENOUS

## 2020-10-25 MED ORDER — BUPIVACAINE IN DEXTROSE 0.75-8.25 % IT SOLN
INTRATHECAL | Status: DC | PRN
  Administered 2020-10-25: 2 mL via INTRATHECAL

## 2020-10-25 MED ORDER — ONDANSETRON HCL 4 MG PO TABS: 4.0000 mg | ORAL_TABLET | Freq: Four times a day (QID) | ORAL | Status: DC | PRN

## 2020-10-25 MED ORDER — TRANEXAMIC ACID 1000 MG/10ML IV SOLN
2000.0000 mg | Freq: Once | INTRAVENOUS | Status: DC
  Filled 2020-10-25: qty 20

## 2020-10-25 MED ORDER — DOCUSATE SODIUM 100 MG PO CAPS
100.0000 mg | ORAL_CAPSULE | Freq: Two times a day (BID) | ORAL | Status: DC
  Administered 2020-10-25 – 2020-10-26 (×2): 100 mg via ORAL
  Filled 2020-10-25 (×2): qty 1

## 2020-10-25 MED ORDER — BUPIVACAINE-EPINEPHRINE (PF) 0.5% -1:200000 IJ SOLN
INTRAMUSCULAR | Status: AC
  Filled 2020-10-25: qty 30

## 2020-10-25 MED ORDER — METOCLOPRAMIDE HCL 5 MG/ML IJ SOLN: 5.0000 mg | Freq: Three times a day (TID) | INTRAMUSCULAR | Status: DC | PRN

## 2020-10-25 MED ORDER — POVIDONE-IODINE 10 % EX SWAB: 2.0000 "application " | Freq: Once | CUTANEOUS | Status: DC

## 2020-10-25 MED ORDER — DEXAMETHASONE SODIUM PHOSPHATE 10 MG/ML IJ SOLN
INTRAMUSCULAR | Status: AC
  Filled 2020-10-25: qty 1

## 2020-10-25 MED ORDER — PHENYLEPHRINE 40 MCG/ML (10ML) SYRINGE FOR IV PUSH (FOR BLOOD PRESSURE SUPPORT)
PREFILLED_SYRINGE | INTRAVENOUS | Status: AC
  Filled 2020-10-25: qty 10

## 2020-10-25 MED ORDER — ONDANSETRON HCL 4 MG/2ML IJ SOLN: 4.0000 mg | Freq: Four times a day (QID) | INTRAMUSCULAR | Status: DC | PRN

## 2020-10-25 MED ORDER — OXYCODONE HCL 5 MG/5ML PO SOLN: 5.0000 mg | Freq: Once | ORAL | Status: AC | PRN

## 2020-10-25 MED ORDER — ACETAMINOPHEN 500 MG PO TABS
1000.0000 mg | ORAL_TABLET | Freq: Four times a day (QID) | ORAL | Status: DC
  Administered 2020-10-25 – 2020-10-26 (×3): 1000 mg via ORAL
  Filled 2020-10-25 (×3): qty 2

## 2020-10-25 MED ORDER — METHOCARBAMOL 500 MG PO TABS: 500.0000 mg | ORAL_TABLET | Freq: Three times a day (TID) | ORAL | 0 refills | Status: DC | PRN

## 2020-10-25 MED ORDER — ACETAMINOPHEN 325 MG PO TABS: 325.0000 mg | ORAL_TABLET | ORAL | Status: DC | PRN

## 2020-10-25 MED ORDER — SODIUM CHLORIDE 0.9 % IV SOLN: INTRAVENOUS | Status: DC

## 2020-10-25 MED ORDER — LACTATED RINGERS IV SOLN: INTRAVENOUS | Status: DC

## 2020-10-25 MED ORDER — STERILE WATER FOR IRRIGATION IR SOLN
Status: DC | PRN
  Administered 2020-10-25: 2000 mL

## 2020-10-25 MED ORDER — ONDANSETRON HCL 4 MG/2ML IJ SOLN
INTRAMUSCULAR | Status: DC | PRN
  Administered 2020-10-25: 4 mg via INTRAVENOUS

## 2020-10-25 MED ORDER — MIDAZOLAM HCL 2 MG/2ML IJ SOLN
1.0000 mg | INTRAMUSCULAR | Status: DC
  Administered 2020-10-25: 2 mg via INTRAVENOUS
  Filled 2020-10-25: qty 2

## 2020-10-25 MED ORDER — FENTANYL CITRATE (PF) 100 MCG/2ML IJ SOLN: 25.0000 ug | INTRAMUSCULAR | Status: DC | PRN

## 2020-10-25 MED ORDER — OXYCODONE-ACETAMINOPHEN 5-325 MG PO TABS: 1.0000 | ORAL_TABLET | ORAL | 0 refills | Status: AC | PRN

## 2020-10-25 MED ORDER — ROPIVACAINE HCL 7.5 MG/ML IJ SOLN
INTRAMUSCULAR | Status: DC | PRN
  Administered 2020-10-25: 25 mL via PERINEURAL

## 2020-10-25 MED ORDER — ACETAMINOPHEN 160 MG/5ML PO SOLN: 325.0000 mg | ORAL | Status: DC | PRN

## 2020-10-25 MED ORDER — PROPOFOL 500 MG/50ML IV EMUL
INTRAVENOUS | Status: AC
  Filled 2020-10-25: qty 50

## 2020-10-25 MED ORDER — HYDROMORPHONE HCL 1 MG/ML IJ SOLN: 0.5000 mg | INTRAMUSCULAR | Status: DC | PRN

## 2020-10-25 MED ORDER — METHOCARBAMOL 1000 MG/10ML IJ SOLN
500.0000 mg | Freq: Four times a day (QID) | INTRAVENOUS | Status: DC | PRN
  Filled 2020-10-25: qty 5

## 2020-10-25 MED ORDER — DEXAMETHASONE SODIUM PHOSPHATE 10 MG/ML IJ SOLN
INTRAMUSCULAR | Status: DC | PRN
  Administered 2020-10-25: 10 mg

## 2020-10-25 MED ORDER — OXYCODONE HCL 5 MG PO TABS
5.0000 mg | ORAL_TABLET | Freq: Once | ORAL | Status: AC | PRN
Start: 2020-10-25 — End: 2020-10-25
  Administered 2020-10-25: 5 mg via ORAL

## 2020-10-25 MED ORDER — BUPIVACAINE-EPINEPHRINE 0.5% -1:200000 IJ SOLN
INTRAMUSCULAR | Status: DC | PRN
  Administered 2020-10-25: 24 mL

## 2020-10-25 MED ORDER — SODIUM CHLORIDE 0.9 % IR SOLN
Status: DC | PRN
  Administered 2020-10-25: 1000 mL

## 2020-10-25 MED ORDER — ACETAMINOPHEN 500 MG PO TABS: 1000.0000 mg | ORAL_TABLET | Freq: Once | ORAL | Status: DC

## 2020-10-25 MED ORDER — FENTANYL CITRATE (PF) 100 MCG/2ML IJ SOLN
50.0000 ug | INTRAMUSCULAR | Status: DC
  Administered 2020-10-25: 50 ug via INTRAVENOUS
  Filled 2020-10-25: qty 2

## 2020-10-25 MED ORDER — METOCLOPRAMIDE HCL 5 MG PO TABS: 5.0000 mg | ORAL_TABLET | Freq: Three times a day (TID) | ORAL | Status: DC | PRN

## 2020-10-25 MED ORDER — CHLORHEXIDINE GLUCONATE 0.12 % MT SOLN
15.0000 mL | Freq: Once | OROMUCOSAL | Status: AC
  Administered 2020-10-25: 15 mL via OROMUCOSAL

## 2020-10-25 MED ORDER — TRANEXAMIC ACID-NACL 1000-0.7 MG/100ML-% IV SOLN
1000.0000 mg | INTRAVENOUS | Status: AC
  Administered 2020-10-25: 1000 mg via INTRAVENOUS
  Filled 2020-10-25: qty 100

## 2020-10-25 MED ORDER — LACTATED RINGERS IV BOLUS
250.0000 mL | Freq: Once | INTRAVENOUS | Status: AC
  Administered 2020-10-25: 250 mL via INTRAVENOUS

## 2020-10-25 MED ORDER — MENTHOL 3 MG MT LOZG: 1.0000 | LOZENGE | OROMUCOSAL | Status: DC | PRN

## 2020-10-25 MED ORDER — PHENOL 1.4 % MT LIQD: 1.0000 | OROMUCOSAL | Status: DC | PRN

## 2020-10-25 MED ORDER — OXYCODONE HCL 5 MG PO TABS
ORAL_TABLET | ORAL | Status: AC
  Filled 2020-10-25: qty 1

## 2020-10-25 MED ORDER — ONDANSETRON HCL 4 MG/2ML IJ SOLN
INTRAMUSCULAR | Status: AC
  Filled 2020-10-25: qty 2

## 2020-10-25 SURGICAL SUPPLY — 60 items
BAG DECANTER FOR FLEXI CONT (MISCELLANEOUS) ×2 IMPLANT
BAG ZIPLOCK 12X15 (MISCELLANEOUS) ×2 IMPLANT
BLADE SAGITTAL 25.0X1.19X90 (BLADE) ×2 IMPLANT
BNDG ELASTIC 6X5.8 VLCR STR LF (GAUZE/BANDAGES/DRESSINGS) ×1 IMPLANT
BOWL SMART MIX CTS (DISPOSABLE) ×2 IMPLANT
CEMENT HV SMART SET (Cement) ×4 IMPLANT
CEMENT TIBIA MBT SIZE 4 (Knees) IMPLANT
COMP FEM CEM STD+ LT LCS (Orthopedic Implant) ×2 IMPLANT
COMP PATELLA PEGX3 CEM STAN+ (Knees) ×2 IMPLANT
COMPONENT FEM CEM STD+ LT LCS (Orthopedic Implant) IMPLANT
COMPONENT PTLA PEGX3 CEM STAN+ (Knees) IMPLANT
COVER SURGICAL LIGHT HANDLE (MISCELLANEOUS) ×2 IMPLANT
COVER WAND RF STERILE (DRAPES) IMPLANT
CUFF TOURN SGL QUICK 34 (TOURNIQUET CUFF) ×2
CUFF TRNQT CYL 34X4.125X (TOURNIQUET CUFF) ×1 IMPLANT
DECANTER SPIKE VIAL GLASS SM (MISCELLANEOUS) ×2 IMPLANT
DRAPE IMP U-DRAPE 54X76 (DRAPES) ×2 IMPLANT
DRAPE ORTHO SPLIT 77X108 STRL (DRAPES)
DRAPE SHEET LG 3/4 BI-LAMINATE (DRAPES) ×4 IMPLANT
DRAPE SURG ORHT 6 SPLT 77X108 (DRAPES) IMPLANT
DRSG ADAPTIC 3X8 NADH LF (GAUZE/BANDAGES/DRESSINGS) ×2 IMPLANT
DRSG CURAD 3X16 NADH (PACKING) ×1 IMPLANT
DRSG PAD ABDOMINAL 8X10 ST (GAUZE/BANDAGES/DRESSINGS) ×2 IMPLANT
DURAPREP 26ML APPLICATOR (WOUND CARE) ×4 IMPLANT
ELECT REM PT RETURN 15FT ADLT (MISCELLANEOUS) ×2 IMPLANT
GAUZE SPONGE 4X4 12PLY STRL (GAUZE/BANDAGES/DRESSINGS) ×2 IMPLANT
GLOVE ECLIPSE 8.0 STRL XLNG CF (GLOVE) ×4 IMPLANT
GLOVE ECLIPSE 8.5 STRL (GLOVE) ×4 IMPLANT
GLOVE SRG 8 PF TXTR STRL LF DI (GLOVE) ×1 IMPLANT
GLOVE SURG UNDER POLY LF SZ8 (GLOVE) ×2
GLOVE SURG UNDER POLY LF SZ8.5 (GLOVE) ×2 IMPLANT
GOWN STRL REUS W/ TWL LRG LVL3 (GOWN DISPOSABLE) ×1 IMPLANT
GOWN STRL REUS W/TWL 2XL LVL3 (GOWN DISPOSABLE) ×2 IMPLANT
GOWN STRL REUS W/TWL LRG LVL3 (GOWN DISPOSABLE) ×2
HANDPIECE INTERPULSE COAX TIP (DISPOSABLE) ×2
HOLDER FOLEY CATH W/STRAP (MISCELLANEOUS) IMPLANT
INSERT TIB LCS RP STD+ 10 (Knees) ×1 IMPLANT
KIT TURNOVER KIT A (KITS) ×2 IMPLANT
MANIFOLD NEPTUNE II (INSTRUMENTS) ×2 IMPLANT
NS IRRIG 1000ML POUR BTL (IV SOLUTION) ×2 IMPLANT
PACK TOTAL KNEE CUSTOM (KITS) ×2 IMPLANT
PADDING CAST COTTON 6X4 STRL (CAST SUPPLIES) ×4 IMPLANT
PADDING CAST SYN 6 (CAST SUPPLIES) ×2
PADDING CAST SYNTHETIC 6X4 NS (CAST SUPPLIES) IMPLANT
PENCIL SMOKE EVACUATOR (MISCELLANEOUS) IMPLANT
PIN STEINMAN FIXATION KNEE (PIN) ×1 IMPLANT
PROTECTOR NERVE ULNAR (MISCELLANEOUS) ×2 IMPLANT
SET HNDPC FAN SPRY TIP SCT (DISPOSABLE) ×1 IMPLANT
STAPLER VISISTAT 35W (STAPLE) ×2 IMPLANT
SUT BONE WAX W31G (SUTURE) ×2 IMPLANT
SUT ETHIBOND NAB CT1 #1 30IN (SUTURE) ×4 IMPLANT
SUT MNCRL AB 3-0 PS2 18 (SUTURE) ×2 IMPLANT
SUT VIC AB 2-0 CT1 27 (SUTURE) ×4
SUT VIC AB 2-0 CT1 TAPERPNT 27 (SUTURE) IMPLANT
SUT VIC AB 2-0 PS2 27 (SUTURE) ×2 IMPLANT
TIBIA MBT CEMENT SIZE 4 (Knees) ×2 IMPLANT
TRAY FOLEY MTR SLVR 16FR STAT (SET/KITS/TRAYS/PACK) ×2 IMPLANT
UNDERPAD 30X36 HEAVY ABSORB (UNDERPADS AND DIAPERS) ×2 IMPLANT
WATER STERILE IRR 1000ML POUR (IV SOLUTION) ×4 IMPLANT
WRAP KNEE MAXI GEL POST OP (GAUZE/BANDAGES/DRESSINGS) ×2 IMPLANT

## 2020-10-25 NOTE — H&P (Signed)
The recent History & Physical has been reviewed. I have personally examined the patient today. There is no interval change to the documented History & Physical. The patient would like to proceed with the procedure.  Valeria Batman 10/25/2020,  9:51 AM

## 2020-10-25 NOTE — Anesthesia Preprocedure Evaluation (Signed)
Anesthesia Evaluation  Patient identified by MRN, date of birth, ID band Patient awake    Reviewed: Allergy & Precautions, H&P , NPO status , Patient's Chart, lab work & pertinent test results, reviewed documented beta blocker date and time   Airway Mallampati: II  TM Distance: >3 FB Neck ROM: full    Dental no notable dental hx.    Pulmonary neg pulmonary ROS,    Pulmonary exam normal breath sounds clear to auscultation       Cardiovascular Exercise Tolerance: Good negative cardio ROS   Rhythm:regular Rate:Normal     Neuro/Psych negative neurological ROS  negative psych ROS   GI/Hepatic negative GI ROS, Neg liver ROS,   Endo/Other  Morbid obesity  Renal/GU negative Renal ROS  negative genitourinary   Musculoskeletal  (+) Arthritis , Osteoarthritis,    Abdominal   Peds  Hematology negative hematology ROS (+)   Anesthesia Other Findings   Reproductive/Obstetrics negative OB ROS                             Anesthesia Physical Anesthesia Plan  ASA: II  Anesthesia Plan: Spinal   Post-op Pain Management: GA combined w/ Regional for post-op pain   Induction:   PONV Risk Score and Plan: 1  Airway Management Planned: Natural Airway and Nasal Cannula  Additional Equipment:   Intra-op Plan:   Post-operative Plan:   Informed Consent: I have reviewed the patients History and Physical, chart, labs and discussed the procedure including the risks, benefits and alternatives for the proposed anesthesia with the patient or authorized representative who has indicated his/her understanding and acceptance.     Dental Advisory Given  Plan Discussed with: CRNA and Anesthesiologist  Anesthesia Plan Comments: (  )        Anesthesia Quick Evaluation

## 2020-10-25 NOTE — Progress Notes (Signed)
Assisted Dr. Oddono with left, ultrasound guided, adductor canal block. Side rails up, monitors on throughout procedure. See vital signs in flow sheet. Tolerated Procedure well.  

## 2020-10-25 NOTE — Transfer of Care (Signed)
Immediate Anesthesia Transfer of Care Note  Patient: [name]  Procedure(s) Performed: LEFT TOTAL KNEE ARTHROPLASTY (Left Knee)  Patient Location: PACU  Anesthesia Type:Spinal and MAC combined with regional for post-op pain  Level of Consciousness: awake, drowsy and patient cooperative  Airway & Oxygen Therapy: Patient Spontanous Breathing and Patient connected to face mask oxygen  Post-op Assessment: Report given to RN and Post -op Vital signs reviewed and stable  Post vital signs: Reviewed and stable  Last Vitals:  Vitals Value Taken Time  BP 132/90 10/25/20 1245  Temp    Pulse 92 10/25/20 1246  Resp 16 10/25/20 1246  SpO2 100 % 10/25/20 1246  Vitals shown include unvalidated device data.  Last Pain:  Vitals:   10/25/20 0851  TempSrc:   PainSc: 0-No pain      Patients Stated Pain Goal: 3 (57/84/69 6295)  Complications: No complications documented.

## 2020-10-25 NOTE — Op Note (Signed)
PATIENT ID:      [name]  MRN:     [id number] DOB/AGE:    1971/02/24 / 50 y.o.       OPERATIVE REPORT    DATE OF PROCEDURE:  10/25/2020       PREOPERATIVE DIAGNOSIS:END STAGE   LEFT KNEE OSTEOARTHRITIS                                                       Estimated body mass index is 34.35 kg/m as calculated from the following:   Height as of 10/21/20: 5\' 10"  (1.778 m).   Weight as of this encounter: 108.6 kg.     POSTOPERATIVE DIAGNOSIS: END STAGE  LEFT KNEE OSTEOARTHRITIS                                                                     Estimated body mass index is 34.35 kg/m as calculated from the following:   Height as of 10/21/20: 5\' 10"  (1.778 m).   Weight as of this encounter: 108.6 kg.     PROCEDURE:  Procedure(s): LEFT TOTAL KNEE ARTHROPLASTY      SURGEON:  12/19/20, MD    ASSISTANT:   LUKE MAGNANT, PA-C   (Present and scrubbed throughout the case, critical for assistance with exposure, retraction, instrumentation, and closure.)          ANESTHESIA: regional, epidural and IV sedation     DRAINS: none :      TOURNIQUET TIME:  Total Tourniquet Time Documented: Thigh (Left) - 76 minutes Total: Thigh (Left) - 76 minutes     COMPLICATIONS:  None   CONDITION:  stable  PROCEDURE IN DETAIL:   [name] 10/25/2020, 12:35 PM

## 2020-10-25 NOTE — Op Note (Signed)
NAMESTEFON, [name] MEDICAL RECORD [id number] ACCOUNT [id number] DATE OF BIRTH:1971-03-23 FACILITY: [facility] LOCATION: [facility] PHYSICIAN:PETER Sharlotte Alamo, MD  OPERATIVE REPORT  DATE OF PROCEDURE:  10/25/2020  PREOPERATIVE DIAGNOSIS:  End-stage osteoarthritis, left knee.  POSTOPERATIVE DIAGNOSIS:  End-stage osteoarthritis, left knee.  PROCEDURE:  Left total knee arthroplasty.  SURGEON:  Joni Fears, MD  ASSISTANT:  Annie Main, PA-C.  ANESTHESIA:  Epidural with adductor canal block and IV sedation.  DRAINS:  None.  TOURNIQUET TIME:  76 minutes.  COMPLICATIONS:  None.  COMPONENTS:  DePuy LCS standard plus femoral component, a #4 rotating keeled tibial tray with a 10 mm polyethylene bridging bearing.  A metal backed 3 peg rotating patella.  Components were secured with polymethyl methacrylate.  DESCRIPTION OF PROCEDURE:  The patient was met in the holding area and identified the left knee as the appropriate operative site and marked it accordingly.  Anesthesia performed an adductor canal block.  The patient was then transported to room #7.  A spinal anesthetic was performed by anesthesia without difficulty.  The patient was comfortably placed in the supine position.  Nursing staff inserted a Foley catheter.  Urine was clear.  Thigh tourniquet was applied to the left leg, the leg was then prepared with chlorhexidine scrub and DuraPrep x2 from the tips of the toes to the tourniquet.  Sterile draping was performed.  A timeout was called.  The extremity was then elevated, Esmarch exsanguinated with a proximal tourniquet at 325 mmHg.  A midline longitudinal incision was then made from the tibial tubercle to the superior pouch.  Via sharp dissection, incision carried down to subcutaneous tissue.  First layer of capsule was incised in the midline.  A medial parapatellar incision was  then made with the Bovie.  The joint was entered.  There was a small clear yellow joint  effusion.  Patella was everted to 180 degrees laterally and the knee flexed to 90 degrees.  There was complete absence of articular cartilage in the medial femoral  condyle and to great extent the medial tibial plateau.  There was about 5 or 6 degrees of varus preoperatively, but it was correctable to neutral.  I measured a standard plus femoral component.  There was a moderate amount of beefy red synovitis.  A synovectomy was performed.  First bony cut was then made transversely in the proximal tibia with a 7 degree angle of declination.  After each bony cut on the tibia and the femur, I checked my alignment with the external jig.  Subsequent cuts were then made on the femur.  I used a 4-degree distal femoral valgus cut.  Flexion and extension gaps were symmetrical at 10 mm.  Laminar spreader was then inserted along the medial and lateral compartments.  I performed a medial and lateral meniscectomy.  ACL and PCL were excised.  Osteophytes from the posterior femoral condyle were removed with a 3/4 inch curved osteotome.  We  again checked flexion and extension gaps at 10 mm.  MCL and LCL remained intact.  Final cuts were then made on the femur for the tapering cuts in the center hole.  Retractors were then placed about the tibia and was advanced anteriorly.  I measured a #4 tibial tray.  This was pinned in place.  Center hole was then made followed by the keeled cut.  With the tibial jig in place, the 10 mm polyethylene bridging  bearing was inserted.  The standard plus trial femoral component was then impacted.  The entire  construct was reduced until the full range of motion remained stable.  Patella was prepared by removing 11 mm of bone, leaving 13 mm of patella thickness.  Three holes were then made and the trial patella applied and reduced and through a full range of motion remained stable.  Trial components were removed.  The joint was copiously irrigated with saline solution.  The final  components were then impacted with polymethyl methacrylate.  We initially applied the #4 tibial tray followed by the 10 mm polyethylene bridging bearing and the standard plus femoral component.  The knee was placed in extension.  Any extraneous  methacrylate was removed from the periphery of the components.  Patella was applied with methacrylate and a patellar clamp.  At approximately 16 minutes the methacrylate had matured.  During this time, we irrigated the joint and injected the deep  capsule with 0.5% Marcaine with epinephrine.  Tourniquet was then deflated.  Gross bleeders of Bovie coagulated.  Any bleeding bone was covered with bone wax.  We thought we had a nice dry field.  The patient did receive Ancef preoperatively as well as a gram of TXA IV.  The deep capsule was then closed with a running #1 Ethibond suture.  The superficial capsule closed with a running 2-0 Vicryl, the subcutaneous with 3-0 Monocryl, skin closed with skin clips.  Aquacel dressing was then applied.  The patient tolerated the procedure without complications.  HN/NUANCE  D:10/25/2020 T:10/25/2020 JOB:[id number]

## 2020-10-25 NOTE — Anesthesia Procedure Notes (Signed)
Anesthesia Regional Block: Adductor canal block   Pre-Anesthetic Checklist: ,, timeout performed, Correct Patient, Correct Site, Correct Laterality, Correct Procedure, Correct Position, site marked, Risks and benefits discussed,  Surgical consent,  Pre-op evaluation,  At surgeon's request and post-op pain management  Laterality: Right  Prep: chloraprep       Needles:  Injection technique: Single-shot  Needle Type: Echogenic Stimulator Needle     Needle Length: 5cm  Needle Gauge: 22     Additional Needles:   Procedures:, nerve stimulator,,, ultrasound used (permanent image in chart),,,,  Narrative:  Start time: 10/25/2020 8:55 AM End time: 10/25/2020 9:00 AM Injection made incrementally with aspirations every 5 mL.  Performed by: Personally  Anesthesiologist: Bethena Midget, MD  Additional Notes: Functioning IV was confirmed and monitors were applied.  A 67mm 22ga Arrow echogenic stimulator needle was used. Sterile prep and drape,hand hygiene and sterile gloves were used. Ultrasound guidance: relevant anatomy identified, needle position confirmed, local anesthetic spread visualized around nerve(s)., vascular puncture avoided.  Image printed for medical record. Negative aspiration and negative test dose prior to incremental administration of local anesthetic. The patient tolerated the procedure well.

## 2020-10-25 NOTE — Discharge Instructions (Signed)
Total Knee Replacement, Care After After the procedure, it is common to have stiffness and discomfort, or redness, pain, and swelling around your cut from surgery (incision). You may also have a small amount of blood or clear fluid coming from your incision. Follow these instructions at home: Your doctor may give you more instructions. If you have problems, contact your doctor. Medicines  Take over-the-counter and prescription medicines only as told by your doctor.  If you were prescribed a blood thinner, take it as told by your doctor.  If told, take steps to prevent problems with pooping (constipation). You may need to: ? Drink enough fluid to keep your pee (urine) pale yellow. ? Take medicines. You will be told what medicines to take. ? Eat foods that are high in fiber. These include beans, whole grains, and fresh fruits and vegetables. ? Limit foods that are high in fat and sugar. These include fried or sweet foods. Incision care  Follow instructions from your doctor about how to take care of your incision. Make sure you: ? Wash your hands with soap and water for at least 20 seconds before and after you change your bandage. If you cannot use soap and water, use hand sanitizer. ? Change your bandage. ? Leave stitches, staples, or skin glue in place for at least 2 weeks. ? Leave tape strips alone unless you are told to take them off. You may trim the edges of the tape strips if they curl up.  Do not take baths, swim, or use a hot tub until your doctor says it is okay.  Check your incision every day for signs of infection. Check for: ? More redness, swelling, or pain. ? More fluid or blood. ? Warmth. ? Pus or a bad smell.   Activity  Rest as told by your doctor.  Get up to take short walks every 1 to 2 hours. Ask for help if you feel weak or unsteady.  Follow instructions from your doctor about: ? Using a walker, crutches, or a cane. ? How much body weight you may safely  support on your affected leg (weight-bearing restrictions). ? How to get out of a bed and chair and how to go up and down stairs. A physical therapist will show you how to do this.  Do exercises as told by your doctor or physical therapist.  Avoid activities that put stress on your knees. These include running, jumping rope, and doing jumping jacks.  Do not play contact sports until your doctor says it is okay.  Return to your normal activities when your doctor says that it is safe. Managing pain, stiffness, and swelling  If told, put ice on your knee. To do this: ? Put ice in a plastic bag or use an icing device (cold flow pad). Follow your doctor's directions about how to use the icing device. ? Place a towel between your skin and the bag or between your skin and the icing device. ? Leave the ice on for 20 minutes, 2-3 times a day. ? Take off the ice if your skin turns bright red. This is very important. If you cannot feel pain, heat, or cold, you have a greater risk of damage to the area.  Move your toes often.  Raise your leg above the level of your heart while you are sitting or lying down. ? Use a few pillows to keep your leg straight. ? Do not put a pillow just under the knee. If the knee is  bent for a long time, this may make the knee stiff.  Wear elastic knee support as told by your doctor.   Safety  To help prevent falls: ? Keep floors clear of objects you may trip over. ? Place items that you may need within easy reach.  Wear an apron or tool belt with pockets for carrying objects. This leaves your hands free to help with your balance.  Do not drive or use machines until your doctor says it is safe.   General instructions  Wear compression stockings as told by your doctor.  Continue with breathing exercises. This helps prevent lung infection.  Do not smoke or use any products that contain nicotine or tobacco. If you need help quitting, ask your doctor.  If you  plan to visit a dentist: ? Tell your doctor. Ask about things to do before your teeth are cleaned. ? Tell your dentist about your new joint.  Keep all follow-up visits. Contact a doctor if:  You have a fever or chills.  You have a cough or feel short of breath.  Your medicine is not controlling your pain.  You have any of these signs of infection around your incision: ? More redness, swelling, or pain. ? More fluid or blood. ? Warmth. ? Pus or a bad smell.  You fall. Get help right away if:  You have very bad pain.  You have trouble breathing.  You have chest pain.  You have pain in your calf or leg.  You have redness, swelling, or warmth in your calf or leg.  Your incision breaks open after the stitches or staples are taken out. These symptoms may be an emergency. Get help right away. Call your local emergency services (911 in the [place]  Do not wait to see if the symptoms will go away.  Do not drive yourself to the hospital. Summary  After the procedure, it is common to have stiffness and discomfort, or redness, pain, and swelling around your incision.  Follow instructions from your doctor about how to take care of your incision.  Use crutches, a walker, or a cane as told by your doctor.  If you were prescribed a blood thinner, take it as told by your doctor.  Keep all follow-up visits. This information is not intended to replace advice given to you by your health care provider. Make sure you discuss any questions you have with your health care provider. Document Revised: 02/23/2020 Document Reviewed: 02/23/2020 Elsevier Patient Education  2021 [employer].

## 2020-10-25 NOTE — Progress Notes (Signed)
Orthopedic Tech Progress Note Patient Details:  [name] 06-Feb-1971 [id number] Off cpm Ortho Devices Type of Ortho Device: Bone foam zero knee Ortho Device/Splint Location: off cpm Ortho Device/Splint Interventions: Ordered,Application,Adjustment   Post Interventions Patient Tolerated: Well Instructions Provided: Care of device   [name] 10/25/2020, 10:27 PM

## 2020-10-25 NOTE — Evaluation (Addendum)
Physical Therapy Evaluation Patient Details Name: [name] MRN: [id number] DOB: 1970-11-21 Today's Date: 10/25/2020   History of Present Illness  s/p L TKA  Clinical Impression  Pt is s/p TKA resulting in the deficits listed below (see PT Problem List).  Pt will benefit from skilled PT to increase their independence and safety with mobility to allow discharge to the venue listed below.   Pt mobilizing well for PT session. See below for mobility details.  After PT session pt c/o some dizziness, appeared mildly diaphoretic. BP 116/77 (BP running higher prior to PT session), SpO2=99-100% on RA, HR 74.  RN notified. Pt will need to be cleared by nursing for d/c, it pt continues to have dizziness or other medical issues may need not be safe for d/c today.   Update @ 1840-->nursing notified MD, pt with continued dizziness,  being admitted d/t continued symptoms. Will continue to follow in acute setting, goals established.     Follow Up Recommendations Follow surgeon's recommendation for DC plan and follow-up therapies    Equipment Recommendations  None recommended by PT    Recommendations for Other Services       Precautions / Restrictions Precautions Precautions: Fall;Knee Restrictions Other Position/Activity Restrictions: no orders to specify WBing, no operative complications per op note      Mobility  Bed Mobility Overal bed mobility: Needs Assistance Bed Mobility: Supine to Sit     Supine to sit: Min guard     General bed mobility comments: for safety    Transfers Overall transfer level: Needs assistance Equipment used: Rolling walker (2 wheeled) Transfers: Sit to/from Stand Sit to Stand: Min guard;Supervision         General transfer comment: cues for hand placement and overall safety  Ambulation/Gait Ambulation/Gait assistance: Min guard Gait Distance (Feet): 75 Feet Assistive device: Rolling walker (2 wheeled) Gait Pattern/deviations: Step-to  pattern;Decreased stance time - left     General Gait Details: cues for sequence, RW position, use of UEs for pain control  Stairs Stairs: Yes Stairs assistance: Min assist;Min guard Stair Management: Step to pattern;Backwards;Two rails;With walker Number of Stairs: 5 (x2) General stair comments: cues for sequence and technique  Wheelchair Mobility    Modified Rankin (Stroke Patients Only)       Balance Overall balance assessment: Mild deficits observed, not formally tested                                           Pertinent Vitals/Pain Pain Assessment: Faces Faces Pain Scale: Hurts even more Pain Location: L knee Pain Descriptors / Indicators: Sore;Guarding;Grimacing Pain Intervention(s): Limited activity within patient's tolerance;Monitored during session;Premedicated before session;Repositioned;Ice applied    Home Living Family/patient expects to be discharged to:: Private residence Living Arrangements: Spouse/significant other;Children Available Help at Discharge: Family (wife and sons) Type of Home: House Home Access: Stairs to enter   Technical brewer of Steps: 4 Home Layout: One level Home Equipment: Environmental consultant - 2 wheels      Prior Function Level of Independence: Independent               Hand Dominance        Extremity/Trunk Assessment   Upper Extremity Assessment Upper Extremity Assessment: Overall WFL for tasks assessed    Lower Extremity Assessment Lower Extremity Assessment: LLE deficits/detail LLE Deficits / Details: ankle WFL, knee extension and hip flexion 2+/5. knee AAROM ~  7 to 60 degrees       Communication   Communication: No difficulties  Cognition Arousal/Alertness: Awake/alert Behavior During Therapy: WFL for tasks assessed/performed Overall Cognitive Status: Within Functional Limits for tasks assessed                                        General Comments      Exercises Total  Joint Exercises Ankle Circles/Pumps: AROM;10 reps;Both Quad Sets: AROM;Both;10 reps Heel Slides: AROM;Both;5 reps Straight Leg Raises: AROM;10 reps;AAROM;Left   Assessment/Plan    PT Assessment All further PT needs can be met in the next venue of care  PT Problem List         PT Treatment Interventions      PT Goals (Current goals can be found in the Care Plan section)  Acute Rehab PT Goals Patient Stated Goal: home today if possible PT Goal Formulation: All assessment and education complete, DC therapy    Frequency     Barriers to discharge        Co-evaluation               AM-PAC PT "6 Clicks" Mobility  Outcome Measure Help needed turning from your back to your side while in a flat bed without using bedrails?: None Help needed moving from lying on your back to sitting on the side of a flat bed without using bedrails?: None Help needed moving to and from a bed to a chair (including a wheelchair)?: A Little Help needed standing up from a chair using your arms (e.g., wheelchair or bedside chair)?: A Little Help needed to walk in hospital room?: A Little Help needed climbing 3-5 steps with a railing? : A Little 6 Click Score: 20    End of Session   Activity Tolerance: Patient tolerated treatment well Patient left: with call bell/phone within reach;with chair alarm set;in chair   PT Visit Diagnosis: Other abnormalities of gait and mobility (R26.89)    Time: 1700-1730 PT Time Calculation (min) (ACUTE ONLY): 30 min   Charges:   PT Evaluation $PT Eval Low Complexity: 1 Low PT Treatments $Gait Training: 8-22 mins        Baxter Flattery, PT  Acute Rehab Dept ([place]) [phone] Pager [phone]  10/25/2020   [facility] 10/25/2020, 6:24 PM

## 2020-10-25 NOTE — Progress Notes (Signed)
In give patient discharge instructions for home reported did not feel well was very dizzy,we have sit him in recliner due to being in bed most of the day he still states is dizzy headed. He did well with PT today his o2 sats have been good as well as vital signs,Dr.Whitfield informed of above orders noted for 24 hours observation admit order.

## 2020-10-25 NOTE — Anesthesia Procedure Notes (Addendum)
Spinal  Patient location during procedure: OR Start time: 10/25/2020 10:24 AM End time: 10/25/2020 10:25 AM Staffing Performed: resident/CRNA  Anesthesiologist: Janeece Riggers, MD Resident/CRNA: West Pugh, CRNA Preanesthetic Checklist Completed: patient identified, IV checked, site marked, risks and benefits discussed, surgical consent, monitors and equipment checked, pre-op evaluation and timeout performed Spinal Block Patient position: sitting Prep: DuraPrep Patient monitoring: heart rate, continuous pulse ox and blood pressure Approach: midline Location: L3-4 Injection technique: single-shot Needle Needle type: Pencan and Introducer  Needle gauge: 24 G Needle length: 10 cm Assessment Sensory level: T4 Additional Notes IV functioning, monitors applied to pt. Expiration date of kit checked and confirmed to be in date. Sterile prep and drape, hand hygiene and sterile gloved used. Pt was positioned and spine was prepped in sterile fashion. Skin was anesthetized with lidocaine. Free flow of clear CSF obtained prior to injecting local anesthetic into CSF x 1 attempt. Spinal needle aspirated freely following injection. Needle was carefully withdrawn, and pt tolerated procedure well. Loss of motor and sensory on exam post injection. Dr Ambrose Pancoast present for entire procedure.

## 2020-10-25 NOTE — Anesthesia Procedure Notes (Addendum)
Procedure Name: MAC Date/Time: 10/25/2020 10:21 AM Performed by: West Pugh, CRNA Pre-anesthesia Checklist: Patient identified, Emergency Drugs available, Suction available, Patient being monitored and Timeout performed Patient Re-evaluated:Patient Re-evaluated prior to induction Oxygen Delivery Method: Simple face mask Preoxygenation: Pre-oxygenation with 100% oxygen Induction Type: IV induction Placement Confirmation: positive ETCO2 Dental Injury: Teeth and Oropharynx as per pre-operative assessment

## 2020-10-25 NOTE — Anesthesia Postprocedure Evaluation (Signed)
Anesthesia Post Note  Patient: [name]  Procedure(s) Performed: LEFT TOTAL KNEE ARTHROPLASTY (Left Knee)     Patient location during evaluation: PACU Anesthesia Type: Spinal Level of consciousness: oriented and awake and alert Pain management: pain level controlled Vital Signs Assessment: post-procedure vital signs reviewed and stable Respiratory status: spontaneous breathing, respiratory function stable and patient connected to nasal cannula oxygen Cardiovascular status: blood pressure returned to baseline and stable Postop Assessment: no headache, no backache and no apparent nausea or vomiting Anesthetic complications: no   No complications documented.  Last Vitals:  Vitals:   10/25/20 0851 10/25/20 0856  BP: (!) 141/98 (!) 132/96  Pulse: 75 75  Resp: 20 17  Temp:    SpO2: 98% 98%    Last Pain:  Vitals:   10/25/20 0851  TempSrc:   PainSc: 0-No pain                 [name]

## 2020-10-25 NOTE — Progress Notes (Signed)
Orthopedic Tech Progress Note Patient Details:  [name] 16-Jul-1971 [id number]  CPM Left Knee CPM Left Knee: On Left Knee Flexion (Degrees): 40 Left Knee Extension (Degrees): 40  Post Interventions Patient Tolerated: Well Instructions Provided: Care of device Ortho Devices Type of Ortho Device: Bone foam zero knee Ortho Device/Splint Location: LLE Ortho Device/Splint Interventions: Ordered,Application,Adjustment   Post Interventions Patient Tolerated: Well Instructions Provided: Care of device   [name] 10/25/2020, 8:28 PM

## 2020-10-26 ENCOUNTER — Other Ambulatory Visit: Payer: Self-pay

## 2020-10-26 ENCOUNTER — Encounter (HOSPITAL_COMMUNITY): Payer: Self-pay | Admitting: Orthopaedic Surgery

## 2020-10-26 DIAGNOSIS — M1712 Unilateral primary osteoarthritis, left knee: Secondary | ICD-10-CM | POA: Diagnosis not present

## 2020-10-26 LAB — CBC
HCT: 42.2 % (ref 39.0–52.0)
Hemoglobin: 14.3 g/dL (ref 13.0–17.0)
MCH: 30.2 pg (ref 26.0–34.0)
MCHC: 33.9 g/dL (ref 30.0–36.0)
MCV: 89 fL (ref 80.0–100.0)
Platelets: 291 10*3/uL (ref 150–400)
RBC: 4.74 MIL/uL (ref 4.22–5.81)
RDW: 12.8 % (ref 11.5–15.5)
WBC: 18.3 10*3/uL — ABNORMAL HIGH (ref 4.0–10.5)
nRBC: 0 % (ref 0.0–0.2)

## 2020-10-26 NOTE — Progress Notes (Signed)
Physical Therapy Treatment Patient Details Name: [name] MRN: [id number] DOB: Jan 22, 1971 Today's Date: 10/26/2020    History of Present Illness s/p L TKA    PT Comments    POD # 1 pm session Daughter in law present during session for "hands on" training on safe handling and esp stairs.   Assisted OOb to amb in hallway, practiced stairs with daughter in law physically assisting Then returned to room to perform some TE's following HEP handout.  Instructed on proper tech, freq as well as use of ICE.   Addressed all mobility questions, discussed appropriate activity, educated on use of ICE.  Pt ready for D/C to home.   Follow Up Recommendations  Follow surgeon's recommendation for DC plan and follow-up therapies     Equipment Recommendations  None recommended by PT    Recommendations for Other Services       Precautions / Restrictions Precautions Precautions: Fall;Knee Precaution Comments: instructed no pillow under knee Restrictions Weight Bearing Restrictions: No    Mobility  Bed Mobility Overal bed mobility: Needs Assistance Bed Mobility: Supine to Sit;Sit to Supine     Supine to sit: Min guard;Supervision Sit to supine: Min guard   General bed mobility comments: demonstrated and instructed on how to use a belt to self assist LE off and onto bed  Transfers Overall transfer level: Needs assistance Equipment used: Rolling walker (2 wheeled) Transfers: Sit to/from [employer] Sit to Stand: Supervision Stand pivot transfers: Supervision;Min guard       General transfer comment: cues for hand placement and overall safety with VC's to extend L LE prior to sit  Ambulation/Gait Ambulation/Gait assistance: Supervision;Min guard Gait Distance (Feet): 65 Feet Assistive device: Rolling walker (2 wheeled) Gait Pattern/deviations: Step-to pattern;Decreased stance time - left     General Gait Details: cues for sequence, RW position, use of UEs for  pain control tolerating a functional distance   Stairs Stairs: Yes Stairs assistance: Min assist Stair Management: No rails;Step to pattern;Forwards;With walker Number of Stairs: 2 General stair comments: with daughter in law "hands on" training   Wheelchair Mobility    Modified Rankin (Stroke Patients Only)       Balance                                            Cognition Arousal/Alertness: Awake/alert Behavior During Therapy: WFL for tasks assessed/performed Overall Cognitive Status: Within Functional Limits for tasks assessed                                 General Comments: AxO x 3 very pleasant      Exercises  5 reps of all seated TE's    General Comments        Pertinent Vitals/Pain Pain Assessment: 0-10 Pain Score: 4  Pain Location: L knee Pain Descriptors / Indicators: Sore;Guarding;Grimacing Pain Intervention(s): Monitored during session;Premedicated before session;Repositioned    Home Living                      Prior Function            PT Goals (current goals can now be found in the care plan section) Progress towards PT goals: Progressing toward goals    Frequency  PT Plan Current plan remains appropriate    Co-evaluation              AM-PAC PT "6 Clicks" Mobility   Outcome Measure  Help needed turning from your back to your side while in a flat bed without using bedrails?: None Help needed moving from lying on your back to sitting on the side of a flat bed without using bedrails?: None Help needed moving to and from a bed to a chair (including a wheelchair)?: A Little Help needed standing up from a chair using your arms (e.g., wheelchair or bedside chair)?: A Little Help needed to walk in hospital room?: A Little Help needed climbing 3-5 steps with a railing? : A Little 6 Click Score: 20    End of Session Equipment Utilized During Treatment: Gait belt Activity  Tolerance: Patient tolerated treatment well Patient left: in chair Nurse Communication: Mobility status PT Visit Diagnosis: Other abnormalities of gait and mobility (R26.89)     Time: 0350-0938 PT Time Calculation (min) (ACUTE ONLY): 25 min  Charges:  $Gait Training: 8-22 mins $Therapeutic Exercise: 8-22 mins                     Felecia Shelling  PTA Acute  Rehabilitation Services Pager      [phone] Office      [phone]

## 2020-10-26 NOTE — Op Note (Signed)
PATIENT ID: [name]        MRN:  [id number]          DOB/AGE: [birth year] / 51 y.o.    Justin Campbell, MD   Justin Code, PA-C [address] [place], [place]  [address]                             [phone]   PROGRESS NOTE  Subjective:  negative for Chest Pain  negative for Shortness of Breath  negative for Nausea/Vomiting   negative for Calf Pain    Tolerating Diet: yes         Patient reports pain as moderate.     Comfortable with present med regimen  Objective: Vital signs in last 24 hours:   Patient Vitals for the past 24 hrs:  BP Temp Temp src Pulse Resp SpO2 Height Weight  10/26/20 0510 (!) 144/94 98.3 F (36.8 C) Oral 87 16 99 % - -  10/26/20 0009 (!) 132/96 98.7 F (37.1 C) Oral 94 18 97 % - -  10/25/20 1947 (!) 143/91 98.7 F (37.1 C) Oral 82 16 98 % 5\' 10"  (1.778 m) -  10/25/20 1632 (!) 147/101 (!) 97.4 F (36.3 C) - 77 20 98 % - -  10/25/20 1600 (!) 157/105 - - 73 18 97 % - -  10/25/20 1500 (!) 143/91 97.8 F (36.6 C) - 71 - 98 % - -  10/25/20 1455 (!) 145/92 97.6 F (36.4 C) - 67 20 98 % - -  10/25/20 1336 (!) 126/97 - - 76 18 99 % - -  10/25/20 1330 125/77 97.7 F (36.5 C) - 71 16 96 % - -  10/25/20 1315 118/87 - - 75 17 99 % - -  10/25/20 1300 130/82 - - 79 16 100 % - -  10/25/20 1245 132/90 - - 91 19 100 % - -  10/25/20 1243 123/78 97.9 F (36.6 C) - 87 15 100 % - -  10/25/20 0856 (!) 132/96 - - 75 17 98 % - -  10/25/20 0851 (!) 141/98 - - 75 20 98 % - -  10/25/20 0850 - - - 74 11 98 % - -  10/25/20 0846 - - - 72 15 98 % - -  10/25/20 0841 - - - 74 (!) 9 98 % - -  10/25/20 0836 - - - 78 16 96 % - -  10/25/20 0751 (!) 144/90 98.3 F (36.8 C) Oral 85 17 98 % - 108.6 kg      Intake/Output from previous day:   02/08 0701 - 02/09 0700 In: 3099.4 [I.V.:2399.4] Out: 1350 [Urine:1300]   Intake/Output this shift:   No intake/output data recorded.   Intake/Output      02/08 0701 02/09 0700 02/09 0701 02/10 0700   I.V. (mL/kg)  2399.4 (22.1)    IV Piggyback 700    Total Intake(mL/kg) 3099.4 (28.5)    Urine (mL/kg/hr) 1300    Blood 50    Total Output 1350    Net +1749.4            LABORATORY DATA: Recent Labs    10/21/20 1032 10/26/20 0318  WBC 6.7 18.3*  HGB 16.2 14.3  HCT 47.4 42.2  PLT 258 291   Recent Labs    10/21/20 1032  NA 137  K 3.9  CL 106  CO2 22  BUN 14  CREATININE 0.99  GLUCOSE  106*  CALCIUM 9.3   No results found for: INR, PROTIME  Recent Radiographic Studies :  DG Chest 2 View  Result Date: 10/04/2020 CLINICAL DATA:  Preoperative EXAM: CHEST - 2 VIEW COMPARISON:  01/27/2018 FINDINGS: The heart size and mediastinal contours are within normal limits. Both lungs are clear. The visualized skeletal structures are unremarkable. IMPRESSION: No acute abnormality of the lungs. Electronically Signed   By: Lauralyn Primes M.D.   On: 10/04/2020 10:38     Examination:  General appearance: alert, cooperative and no distress  Wound Exam: clean, dry, intact   Drainage:  None: wound tissue dry  Motor Exam: EHL, FHL and Anterior Tibial Intact  Sensory Exam: Superficial Peroneal, Deep Peroneal and Tibial normal  Vascular Exam: Normal  Assessment:    1 Day Post-Op  Procedure(s) (LRB): LEFT TOTAL KNEE ARTHROPLASTY (Left)  ADDITIONAL DIAGNOSIS:  Active Problems:   Dizziness of unknown cause   S/P total knee arthroplasty, left   S/p bilateral revision of total hip replacement     Plan: Physical Therapy as ordered Weight Bearing as Tolerated (WBAT)  DVT Prophylaxis:  Aspirin and TED hose  DISCHARGE PLAN: Home  DISCHARGE NEEDS: HHPT, CPM, Walker and 3-in-1 comode seat   Patient's anticipated LOS is less than 2 midnights, meeting these requirements: - Younger than 56 - Lives within 1 hour of care - Has a competent adult at home to recover with post-op recover - NO history of  - Chronic pain requiring opiods  - Diabetes  - Coronary Artery Disease  - Heart failure  -  Heart attack  - Stroke  - DVT/VTE  - Cardiac arrhythmia  - Respiratory Failure/COPD  - Renal failure  - Anemia  - Advanced Liver disease Awake,alert-comfortable. Foley out and has voided. No longer dizzy. No SOB or chest pain. Dressing clean and dry          Justin Code, PA-C [facility]  10/26/2020 7:09 AM

## 2020-10-26 NOTE — Progress Notes (Signed)
Orthopedic Tech Progress Note Patient Details:  [name] Jan 08, 1971 [id number]  CPM Left Knee CPM Left Knee: On Left Knee Flexion (Degrees): 50 Left Knee Extension (Degrees): 10  Post Interventions Patient Tolerated: Well Instructions Provided: Adjustment of device  Zebedee Iba 10/26/2020, 8:53 AM

## 2020-10-26 NOTE — Discharge Summary (Signed)
Physician Discharge Summary      Patient ID: [name] MRN: [id number] DOB/AGE: [birth year] 50 y.o.  Admit date: 10/25/2020 Discharge date: 10/26/2020  Admission Diagnoses:  Active Problems:   Dizziness of unknown cause   S/P total knee arthroplasty, left   S/p bilateral revision of total hip replacement   Discharge Diagnoses:  Same  Surgeries: Procedure(s): LEFT TOTAL KNEE ARTHROPLASTY on 10/25/2020   Consultants:   Discharged Condition: Stable  Hospital Course: [name] is an 50 y.o. male who was admitted 10/25/2020 with a chief complaint of left knee pain, and found to have a diagnosis of left knee OA.  They were brought to the operating room on 10/25/2020 and underwent the above named procedures.  Pt awoke from anesthesia without complication and was transferred to the floor. On POD1, patient's pain was controlled and he was ready for discharge upon eval by Dr. Cleophas Dunker. He was discharged home on POD1 and will work with HHPT prior to first postop appointment.  Pt will f/u with Dr. Cleophas Dunker in clinic in ~2 weeks.   Antibiotics given:  Anti-infectives (From admission, onward)   Start     Dose/Rate Route Frequency Ordered Stop   10/25/20 2000  ceFAZolin (ANCEF) IVPB 2g/100 mL premix        2 g 200 mL/hr over 30 Minutes Intravenous Every 8 hours 10/25/20 1939 10/26/20 0711   10/25/20 0745  ceFAZolin (ANCEF) IVPB 2g/100 mL premix        2 g 200 mL/hr over 30 Minutes Intravenous On call to O.R. 10/25/20 0741 10/25/20 1056    .  Recent vital signs:  Vitals:   10/26/20 0009 10/26/20 0510  BP: (!) 132/96 (!) 144/94  Pulse: 94 87  Resp: 18 16  Temp: 98.7 F (37.1 C) 98.3 F (36.8 C)  SpO2: 97% 99%    Recent laboratory studies:  Results for orders placed or performed during the hospital encounter of 10/25/20  CBC  Result Value Ref Range   WBC 18.3 (H) 4.0 - 10.5 K/uL   RBC 4.74 4.22 - 5.81 MIL/uL   Hemoglobin 14.3 13.0 - 17.0 g/dL   HCT 55.3 74.8 - 27.0 %    MCV 89.0 80.0 - 100.0 fL   MCH 30.2 26.0 - 34.0 pg   MCHC 33.9 30.0 - 36.0 g/dL   RDW 78.6 75.4 - 49.2 %   Platelets 291 150 - 400 K/uL   nRBC 0.0 0.0 - 0.2 %    Discharge Medications:   Allergies as of 10/26/2020   No Known Allergies     Medication List    TAKE these medications   aspirin 81 MG chewable tablet Commonly known as: Aspirin Childrens Chew 1 tablet (81 mg total) by mouth daily.   methocarbamol 500 MG tablet Commonly known as: Robaxin Take 1 tablet (500 mg total) by mouth every 8 (eight) hours as needed for muscle spasms.   multivitamin with minerals Tabs tablet Take 1 tablet by mouth daily.   naproxen sodium 220 MG tablet Commonly known as: ALEVE Take 220 mg by mouth daily as needed (pain).   oxyCODONE-acetaminophen 5-325 MG tablet Commonly known as: Percocet Take 1 tablet by mouth every 4 (four) hours as needed for severe pain.       Diagnostic Studies: DG Chest 2 View  Result Date: 10/04/2020 CLINICAL DATA:  Preoperative EXAM: CHEST - 2 VIEW COMPARISON:  01/27/2018 FINDINGS: The heart size and mediastinal contours are within normal limits. Both lungs are clear. The visualized  skeletal structures are unremarkable. IMPRESSION: No acute abnormality of the lungs. Electronically Signed   By: Lauralyn Primes M.D.   On: 10/04/2020 10:38    Disposition:   Discharge Instructions    Call MD / Call 911   Complete by: As directed    If you experience chest pain or shortness of breath, CALL 911 and be transported to the hospital emergency room.  If you develope a fever above 101 F, pus (white drainage) or increased drainage or redness at the wound, or calf pain, call your surgeon's office.   Constipation Prevention   Complete by: As directed    Drink plenty of fluids.  Prune juice may be helpful.  You may use a stool softener, such as Colace (over the counter) 100 mg twice a day.  Use MiraLax (over the counter) for constipation as needed.   Diet - low sodium heart  healthy   Complete by: As directed    Discharge instructions   Complete by: As directed    Remove ace-wrap tomorrow morning. You may shower, dressing is waterproof.  Do not remove the adhesive dressing, we will remove it at your first post-op appointment.  Do not take a bath or soak the knee in a tub or pool.  You may weightbear as you can tolerate on the operative leg with a walker.  Use knee range of motion exercises to work on knee flexion.  Use a pillow under your heel to work on getting your leg straight. Do NOT put a pillow under your knee.  You will follow-up with Dr. Cleophas Dunker in the clinic in 2 weeks at your given appointment date.    Dental Antibiotics:  In most cases prophylactic antibiotics for Dental procdeures after total joint surgery are not necessary.  Exceptions are as follows:  1. History of prior total joint infection  2. Severely immunocompromised (Organ Transplant, cancer chemotherapy, Rheumatoid biologic meds such as Humera)  3. Poorly controlled diabetes (A1C &gt; 8.0, blood glucose over 200)  If you have one of these conditions, contact your surgeon for an antibiotic prescription, prior to your dental procedure.   Increase activity slowly as tolerated   Complete by: As directed          Signed: Julieanne Cotton 10/26/2020, 8:33 AM

## 2020-10-26 NOTE — Progress Notes (Signed)
Physical Therapy Treatment Patient Details Name: [name] MRN: [id number] DOB: [birth year] Today's Date: 10/26/2020    History of Present Illness s/p L TKA    PT Comments    POD # 1 am session Assisted OOB to amb in hallway, practiced stairs Then returned to room to perform some TE's following HEP handout.  Instructed on proper tech, freq as well as use of ICE.   Pt will need another PT session with family to address stairs again as pt has no rails.    Follow Up Recommendations  Follow surgeon's recommendation for DC plan and follow-up therapies     Equipment Recommendations  None recommended by PT    Recommendations for Other Services       Precautions / Restrictions Precautions Precautions: Fall;Knee Precaution Comments: instructed no pillow under knee Restrictions Weight Bearing Restrictions: No    Mobility  Bed Mobility Overal bed mobility: Needs Assistance Bed Mobility: Supine to Sit;Sit to Supine     Supine to sit: Min guard;Supervision Sit to supine: Min guard   General bed mobility comments: demonstrated and instructed on how to use a belt to self assist LE off and onto bed  Transfers Overall transfer level: Needs assistance Equipment used: Rolling walker (2 wheeled) Transfers: Sit to/from [employer] Sit to Stand: Supervision Stand pivot transfers: Supervision;Min guard       General transfer comment: cues for hand placement and overall safety with VC's to extend L LE prior to sit  Ambulation/Gait Ambulation/Gait assistance: Supervision;Min guard Gait Distance (Feet): 65 Feet Assistive device: Rolling walker (2 wheeled) Gait Pattern/deviations: Step-to pattern;Decreased stance time - left     General Gait Details: cues for sequence, RW position, use of UEs for pain control tolerating a functional distance   Stairs Stairs: Yes Stairs assistance: Min assist Stair Management: No rails;Step to pattern;Forwards;With  walker Number of Stairs: 2 General stair comments: cues for sequence and technique   Wheelchair Mobility    Modified Rankin (Stroke Patients Only)       Balance                                            Cognition Arousal/Alertness: Awake/alert Behavior During Therapy: WFL for tasks assessed/performed Overall Cognitive Status: Within Functional Limits for tasks assessed                                 General Comments: AxO x 3 very pleasant      Exercises   Total Knee Replacement TE's following HEP handout 10 reps B LE ankle pumps 05 reps towel squeezes 05 reps knee presses 05 reps heel slides  05 reps SAQ's 05 reps SLR's 05 reps ABD Educated on use of gait belt to assist with TE's Followed by ICE     General Comments        Pertinent Vitals/Pain Pain Assessment: 0-10 Pain Score: 4  Pain Location: L knee Pain Descriptors / Indicators: Sore;Guarding;Grimacing Pain Intervention(s): Monitored during session;Premedicated before session;Repositioned    Home Living                      Prior Function            PT Goals (current goals can now be found in the care plan section) Progress towards PT  goals: Progressing toward goals    Frequency           PT Plan Current plan remains appropriate    Co-evaluation              AM-PAC PT "6 Clicks" Mobility   Outcome Measure  Help needed turning from your back to your side while in a flat bed without using bedrails?: None Help needed moving from lying on your back to sitting on the side of a flat bed without using bedrails?: None Help needed moving to and from a bed to a chair (including a wheelchair)?: A Little Help needed standing up from a chair using your arms (e.g., wheelchair or bedside chair)?: A Little Help needed to walk in hospital room?: A Little Help needed climbing 3-5 steps with a railing? : A Little 6 Click Score: 20    End of Session  Equipment Utilized During Treatment: Gait belt Activity Tolerance: Patient tolerated treatment well Patient left: with call bell/phone within reach;with chair alarm set;in chair Nurse Communication: Mobility status PT Visit Diagnosis: Other abnormalities of gait and mobility (R26.89)     Time: 1448-1856 PT Time Calculation (min) (ACUTE ONLY): 25 min  Charges:  $Gait Training: 8-22 mins $Therapeutic Exercise: 8-22 mins                     Felecia Shelling  PTA Acute  Rehabilitation Services Pager      [phone] Office      [phone]

## 2020-11-02 ENCOUNTER — Telehealth: Payer: Self-pay | Admitting: Orthopaedic Surgery

## 2020-11-02 NOTE — Telephone Encounter (Signed)
Pt son called and states that his father is out of his pain medication oxycodone and would like a refill.

## 2020-11-04 NOTE — Telephone Encounter (Signed)
Please advise 

## 2020-11-07 ENCOUNTER — Other Ambulatory Visit: Payer: Self-pay | Admitting: Orthopaedic Surgery

## 2020-11-07 MED ORDER — OXYCODONE-ACETAMINOPHEN 5-325 MG PO TABS: 1.0000 | ORAL_TABLET | Freq: Four times a day (QID) | ORAL | 0 refills | Status: DC | PRN

## 2020-11-07 NOTE — Telephone Encounter (Signed)
Will send in oxycodone

## 2020-11-07 NOTE — Telephone Encounter (Signed)
I called and advised. 

## 2020-11-08 ENCOUNTER — Ambulatory Visit (INDEPENDENT_AMBULATORY_CARE_PROVIDER_SITE_OTHER): Payer: BC Managed Care – PPO

## 2020-11-08 ENCOUNTER — Other Ambulatory Visit: Payer: Self-pay

## 2020-11-08 ENCOUNTER — Ambulatory Visit (INDEPENDENT_AMBULATORY_CARE_PROVIDER_SITE_OTHER): Payer: BC Managed Care – PPO | Admitting: Orthopaedic Surgery

## 2020-11-08 ENCOUNTER — Encounter: Payer: Self-pay | Admitting: Orthopaedic Surgery

## 2020-11-08 VITALS — Ht 70.0 in | Wt 239.0 lb

## 2020-11-08 DIAGNOSIS — Z96652 Presence of left artificial knee joint: Secondary | ICD-10-CM

## 2020-11-08 NOTE — Progress Notes (Signed)
Office Visit Note   Patient: [name]           Date of Birth: Jan 23, 1971           MRN: [id number] Visit Date: 11/08/2020              Requested by: Jac Canavan, PA-C [address] [place],  [place] [address] PCP: Jac Canavan, PA-C   Assessment & Plan: Visit Diagnoses:  1. History of total knee arthroplasty, left   2. S/P total knee arthroplasty, left     Plan: 2-week status post primary left total knee replacement doing well.  Bearing full weight with a walker.  Has home therapy.  We will plan on outpatient therapy at our facility starting next week.  Staples removed and Steri-Strips applied.  Range of motion is 0 to about 90 degrees.  No instability.  No calf pain.  Has a small rash in his leg but very minimal itching and minimal swelling.  Encouraged range of motion exercises.  Office 2 weeks  Follow-Up Instructions: Return in about 2 weeks (around 11/22/2020).   Orders:  Orders Placed This Encounter  Procedures  . XR KNEE 3 VIEW LEFT   No orders of the defined types were placed in this encounter.     Procedures: No procedures performed   Clinical Data: No additional findings.   Subjective: Chief Complaint  Patient presents with  . Left Knee - Follow-up    Left total knee arthroplasty 10/25/2020  Patient presents today for follow up on his left knee. He had a left total knee arthroplasty on 10/25/2020. He is now two weeks out from surgery. He is doing great. No complaints. He has been doing home therapy. He is using a walker for ambulation.  HPI  Review of Systems   Objective: Vital Signs: Ht 5\' 10"  (1.778 m)   Wt 239 lb (108.4 kg)   BMI 34.29 kg/m   Physical Exam  Ortho Exam left knee incision healing without problem.  Clips removed and Steri-Strips applied.  Has full extension and about 90 degrees of flexion.  Small knee effusion.  Some normal swelling about the knee.  No localized areas of tenderness.  Neurologically intact.  Does have  a reddish rash in his left leg but appears to be improving according to the patient has not had any itching.  Specialty Comments:  No specialty comments available.  Imaging: XR KNEE 3 VIEW LEFT  Result Date: 11/08/2020 Films of the left knee were obtained in 3 projections.  Total knee replacement in excellent position without any obvious complications.  Good alignment of components    PMFS History: Patient Active Problem List   Diagnosis Date Noted  . Dizziness of unknown cause 10/25/2020  . S/P total knee arthroplasty, left 10/25/2020  . S/p bilateral revision of total hip replacement 10/25/2020  . Primary osteoarthritis of left knee 04/06/2018  . Seasonal allergic rhinitis due to pollen 04/06/2018  . History of Bell's palsy 04/06/2018   Past Medical History:  Diagnosis Date  . Arthritis     Family History  Problem Relation Age of Onset  . Hypertension Mother   . Prostate cancer Father   . Diabetes Sister     Past Surgical History:  Procedure Laterality Date  . NO PAST SURGERIES    . TOTAL KNEE ARTHROPLASTY Left 10/25/2020   Procedure: LEFT TOTAL KNEE ARTHROPLASTY;  Surgeon: 12/23/2020, MD;  Location: [facility];  Service: Orthopedics;  Laterality: Left;  Social History   Occupational History  . Not on file  Tobacco Use  . Smoking status: Never Smoker  . Smokeless tobacco: Never Used  Vaping Use  . Vaping Use: Never used  Substance and Sexual Activity  . Alcohol use: No  . Drug use: No  . Sexual activity: Not on file

## 2020-11-08 NOTE — Addendum Note (Signed)
Addended by: Wendi Maya on: 11/08/2020 03:05 PM   Modules accepted: Orders

## 2020-11-09 ENCOUNTER — Ambulatory Visit (INDEPENDENT_AMBULATORY_CARE_PROVIDER_SITE_OTHER): Payer: BC Managed Care – PPO | Admitting: Physical Therapy

## 2020-11-09 ENCOUNTER — Encounter: Payer: Self-pay | Admitting: Physical Therapy

## 2020-11-09 DIAGNOSIS — M25662 Stiffness of left knee, not elsewhere classified: Secondary | ICD-10-CM

## 2020-11-09 DIAGNOSIS — M25562 Pain in left knee: Secondary | ICD-10-CM | POA: Diagnosis not present

## 2020-11-09 NOTE — Therapy (Signed)
[facility] Physical Therapy [address] [place], [place], [address] Phone: [phone]   Fax:  [phone]  Physical Therapy Evaluation  Patient Details  Name: [name] MRN: [id number] Date of Birth: January 03, 1971 Referring Provider (PT): [name], MD   Encounter Date: 11/09/2020   PT End of Session - 11/09/20 1014    Visit Number 1    Number of Visits 20    Date for PT Re-Evaluation 01/06/21    Authorization Type 3x/ week progressing to 2x/ week    PT Start Time 0845    PT Stop Time 0930    PT Time Calculation (min) 45 min    Activity Tolerance Patient tolerated treatment well    Behavior During Therapy [facility] for tasks assessed/performed           Past Medical History:  Diagnosis Date  . Arthritis     Past Surgical History:  Procedure Laterality Date  . NO PAST SURGERIES    . TOTAL KNEE ARTHROPLASTY Left 10/25/2020   Procedure: LEFT TOTAL KNEE ARTHROPLASTY;  Surgeon: Valeria Batman, MD;  Location: [facility];  Service: Orthopedics;  Laterality: Left;    There were no vitals filed for this visit.    Subjective Assessment - 11/09/20 0856    Subjective Mr. Aughenbaugh arriving s/p left total knee arthroplasty on 10/25/2020. Pt  reporting 4/10 pain in Left knee. Pt amb with RW.    Pertinent History Left TKA on 10/25/2020.    Limitations Walking;Standing;House hold activities    Diagnostic tests X-ray, hardward intact    Patient Stated Goals Get back to work, bend my knee    Currently in Pain? Yes    Pain Score 4     Pain Location Knee    Pain Orientation Left    Pain Descriptors / Indicators Aching;Sore;Tightness    Pain Type Surgical pain    Pain Onset 1 to 4 weeks ago    Pain Frequency Constant    Aggravating Factors  bending    Pain Relieving Factors ice, pain meds, pt using CPM    Effect of Pain on Daily Activities unable to work, difficulty with ADL's              [facility] PT Assessment - 11/09/20 0001      Assessment   Medical  Diagnosis s/p L TKA, O17.510    Referring Provider (PT) [name], MD    Hand Dominance Right    Prior Therapy no      Precautions   Precautions None      Restrictions   Weight Bearing Restrictions No      Balance Screen   Has the patient fallen in the past 6 months No    Is the patient reluctant to leave their home because of a fear of falling?  No      Home Environment   Living Environment Private residence    Living Arrangements Spouse/significant other;Children    Type of Home House    Home Access Stairs to enter    Entrance Stairs-Number of Steps 4    Entrance Stairs-Rails None      Prior Function   Level of Independence Independent    Vocation Full time employment    Chiropodist    Leisure sports, volleyball, soccer, baseball      Cognition   Overall Cognitive Status Within Functional Limits for tasks assessed      Observation/Other Assessments   Focus on Therapeutic Outcomes (FOTO)  47 (  predicted 64)      Observation/Other Assessments-Edema    Edema Circumferential   L knee is 4 cm > than R knee.     Posture/Postural Control   Posture/Postural Control Postural limitations      ROM / Strength   AROM / PROM / Strength AROM;PROM;Strength      AROM   AROM Assessment Site Knee    Right/Left Knee Right;Left    Right Knee Extension 0    Right Knee Flexion 82    Left Knee Extension 0      PROM   PROM Assessment Site Knee    Right/Left Knee Left    Left Knee Extension 0    Left Knee Flexion 86      Strength   Strength Assessment Site Knee    Right/Left Knee Right;Left    Right Knee Flexion 5/5    Right Knee Extension 5/5    Left Knee Flexion 4/5    Left Knee Extension 4-/5      Flexibility   Soft Tissue Assessment /Muscle Length yes    Hamstrings L: 68 degrees, R: 72 degrees   opposite knee bent in supine     Palpation   Palpation comment PPT: around medial and lateral joint line      Ambulation/Gait   Assistive device  Rolling walker    Gait Pattern Step-through pattern;Decreased stance time - left;Decreased step length - left;Decreased step length - right;Decreased dorsiflexion - left;Antalgic                      Objective measurements completed on examination: See above findings.       [facility] Adult PT Treatment/Exercise - 11/09/20 0001      Exercises   Exercises Knee/Hip      Knee/Hip Exercises: Seated   Long Arc Quad Strengthening;Left;10 reps    Sit to [employer] 2 sets;with UE support      Knee/Hip Exercises: Supine   Quad Sets Strengthening;AROM;Left;10 reps    Heel Slides Strengthening;Left;10 reps      Modalities   Modalities Vasopneumatic      Vasopneumatic   Number Minutes Vasopneumatic  10 minutes    Vasopnuematic Location  Knee    Vasopneumatic Pressure Low    Vasopneumatic Temperature  34                  PT Education - 11/09/20 1050    Education Details P TPOC, HEP    Person(s) Educated Patient    Methods Explanation;Demonstration;Handout;Verbal cues;Tactile cues    Comprehension Returned demonstration;Verbalized understanding            PT Short Term Goals - 11/09/20 1021      PT SHORT TERM GOAL #1   Title Pt will be independent in his HEP.    Time 3    Period Weeks    Status New    Target Date 12/02/20      PT SHORT TERM GOAL #2   Title Pt will be able to amb with least restrictive device on level surfaces for 150 feet safely with pain </= 4/10.    Time 3    Period Weeks    Status New    Target Date 12/02/20             PT Long Term Goals - 11/09/20 1022      PT LONG TERM GOAL #1   Title Pt will improve his FOTO from 47 to >/= 64.  Baseline 47 on 11/09/2020.    Time 8    Period Weeks    Status New    Target Date 01/06/21      PT LONG TERM GOAL #2   Title Pt will be able to perform left knee flexion to >= 120 degrees.    Time 8    Period Weeks    Status New    Target Date 01/06/21      PT LONG TERM GOAL #3   Title Pt  will be able to amb up and down 5 steps  with no hand rail with pain </= 2/10 in order to get in and out of pt's home safely.    Time 8    Period Weeks    Status New    Target Date 01/06/21      PT LONG TERM GOAL #4   Title Pt will be able to improve his left LE strength to grossly 5/5 in order to improve gait and ADL's.    Time 8    Period Weeks    Status New    Target Date 01/06/21      PT LONG TERM GOAL #5   Title Pt will be able to amb with no assistive device >/= 1000 feet on community level surfaces.    Time 8    Period Weeks    Status New    Target Date 01/06/21                  Plan - 11/09/20 1015    Clinical Impression Statement Pt arriving to therapy s/p left TKA on 10/25/2020. Pt amb with a rolling walker with antalgic gait with decreased step length and stance on left LE. Pt reporting using the CPM and reaching 90 degrees. Pt with limited ROM and strength in L LE compared to the right. Pt with 4 cm difference in knee circumference. 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 Skilled PT needed to address pt's impairments with the below interventions.    Examination-Activity Limitations Stairs;Squat;Stand;Lift    Examination-Participation Restrictions Other;Occupation;Community Activity;Driving    Stability/Clinical Decision Making Stable/Uncomplicated    Clinical Decision Making Low    Rehab Potential Good    PT Frequency Other (comment)   3x/week and progess to 2x/ week   PT Duration 8 weeks    PT Treatment/Interventions ADLs/Self Care Home Management;Cryotherapy;Electrical Stimulation;Ultrasound;Gait training;Stair training;Functional mobility training;Therapeutic activities;Therapeutic exercise;Balance training;Neuromuscular re-education;Patient/family education;Manual techniques;Passive range of motion;Scar mobilization;Vasopneumatic Device;Taping    PT Next Visit Plan TKA protocol, LE strengtheing,  ROM, modalities as needed    PT Home Exercise Plan Access Code: [email or web address]  URL: [email or web address]  Date: 11/09/2020  Prepared by: Narda Amber    Exercises  Long Sitting Quad Set with Towel Roll Under Heel - 3 x daily - 7 x weekly - 2 sets - 10 reps  Supine Active Straight Leg Raise - 3 x daily - 7 x weekly - 2 sets - 10 reps  Supine Heel Slides - 3 x daily - 7 x weekly - 2 sets - 10 reps  Sit to Stand with Counter Support - 3 x daily - 7 x weekly - 2 sets - 10 reps    Consulted and Agree with Plan of Care Patient           Patient will benefit from skilled therapeutic intervention in order to improve the following deficits and impairments:  Pain,Abnormal gait,Difficulty walking,Decreased balance,Impaired flexibility,Increased edema,Decreased activity tolerance,Decreased strength,Decreased range of motion,Decreased mobility  Visit Diagnosis: Acute pain of left knee  Stiffness of left knee, not elsewhere classified     Problem List Patient Active Problem List   Diagnosis Date Noted  . Dizziness of unknown cause 10/25/2020  . S/P total knee arthroplasty, left 10/25/2020  . S/p bilateral revision of total hip replacement 10/25/2020  . Primary osteoarthritis of left knee 04/06/2018  . Seasonal allergic rhinitis due to pollen 04/06/2018  . History of Bell's palsy 04/06/2018    Sharmon LeydenJennifer R Niyam Bisping, PT,MPT 11/09/2020, 10:50 AM  [facility] Physical Therapy [address] [place], [place], [address] Phone: [phone]   Fax:  [phone]  Name: [name] MRN: [id number] Date of Birth: April 27, 1971

## 2020-11-09 NOTE — Patient Instructions (Signed)
Access Code: [email or web address] URL: [email or web address] Date: 11/09/2020 Prepared by: Narda Amber  Exercises Long Sitting Quad Set with Towel Roll Under Heel - 3 x daily - 7 x weekly - 2 sets - 10 reps Supine Active Straight Leg Raise - 3 x daily - 7 x weekly - 2 sets - 10 reps Supine Heel Slides - 3 x daily - 7 x weekly - 2 sets - 10 reps Sit to Stand with Counter Support - 3 x daily - 7 x weekly - 2 sets - 10 reps

## 2020-11-11 ENCOUNTER — Ambulatory Visit (INDEPENDENT_AMBULATORY_CARE_PROVIDER_SITE_OTHER): Payer: BC Managed Care – PPO | Admitting: Physical Therapy

## 2020-11-11 ENCOUNTER — Other Ambulatory Visit: Payer: Self-pay

## 2020-11-11 DIAGNOSIS — M25662 Stiffness of left knee, not elsewhere classified: Secondary | ICD-10-CM | POA: Diagnosis not present

## 2020-11-11 DIAGNOSIS — M25562 Pain in left knee: Secondary | ICD-10-CM | POA: Diagnosis not present

## 2020-11-11 DIAGNOSIS — R600 Localized edema: Secondary | ICD-10-CM

## 2020-11-11 NOTE — Therapy (Signed)
[facility] Physical Therapy [address] [place], [place], [address] Phone: [phone]   Fax:  [phone]  Physical Therapy Treatment  Patient Details  Name: [name] MRN: [id number] Date of Birth: 09/17/1971 Referring Provider (PT): [name], MD   Encounter Date: 11/11/2020   PT End of Session - 11/11/20 1054    Visit Number 2    Number of Visits 20    Date for PT Re-Evaluation 01/06/21    Authorization Type 3x/ week progressing to 2x/ week    PT Start Time 1017    PT Stop Time 1102    PT Time Calculation (min) 45 min    Activity Tolerance Patient tolerated treatment well    Behavior During Therapy [facility] for tasks assessed/performed           Past Medical History:  Diagnosis Date  . Arthritis     Past Surgical History:  Procedure Laterality Date  . NO PAST SURGERIES    . TOTAL KNEE ARTHROPLASTY Left 10/25/2020   Procedure: LEFT TOTAL KNEE ARTHROPLASTY;  Surgeon: Valeria Batman, MD;  Location: [facility];  Service: Orthopedics;  Laterality: Left;    There were no vitals filed for this visit.   Subjective Assessment - 11/11/20 1028    Subjective he relays 5/10 pain overall in his Lt knee    Pertinent History Left TKA on 10/25/2020.    Limitations Walking;Standing;House hold activities    Diagnostic tests X-ray, hardward intact    Patient Stated Goals Get back to work, bend my knee    Pain Onset 1 to 4 weeks ago            [facility] Adult PT Treatment/Exercise - 11/11/20 0001      Knee/Hip Exercises: Stretches   Knee: Self-Stretch Limitations seated tailgate stretch for flexion with self overpressure 10 sec X 3 min      Knee/Hip Exercises: Aerobic   Nustep L3 X 10 min UE/LE holding 5 sec at end range flexion      Knee/Hip Exercises: Seated   Long Arc Quad Left;2 sets;10 reps    Long Arc Quad Weight --   2.5 lbs   Other Seated Knee/Hip Exercises seated SLR 2 sets of 10    Hamstring Curl Left;2 sets;10 reps    Hamstring  Limitations red    Sit to [employer] 10 reps;without UE support   raised mat surface     Vasopneumatic   Number Minutes Vasopneumatic  10 minutes    Vasopnuematic Location  Knee    Vasopneumatic Pressure Medium    Vasopneumatic Temperature  34   with pillow case     Manual Therapy   Manual therapy comments Lt knee PROM and flexion mobs grade 2                    PT Short Term Goals - 11/09/20 1021      PT SHORT TERM GOAL #1   Title Pt will be independent in his HEP.    Time 3    Period Weeks    Status New    Target Date 12/02/20      PT SHORT TERM GOAL #2   Title Pt will be able to amb with least restrictive device on level surfaces for 150 feet safely with pain </= 4/10.    Time 3    Period Weeks    Status New    Target Date 12/02/20  PT Long Term Goals - 11/09/20 1022      PT LONG TERM GOAL #1   Title Pt will improve his FOTO from 47 to >/= 64.    Baseline 47 on 11/09/2020.    Time 8    Period Weeks    Status New    Target Date 01/06/21      PT LONG TERM GOAL #2   Title Pt will be able to perform left knee flexion to >= 120 degrees.    Time 8    Period Weeks    Status New    Target Date 01/06/21      PT LONG TERM GOAL #3   Title Pt will be able to amb up and down 5 steps  with no hand rail with pain </= 2/10 in order to get in and out of pt's home safely.    Time 8    Period Weeks    Status New    Target Date 01/06/21      PT LONG TERM GOAL #4   Title Pt will be able to improve his left LE strength to grossly 5/5 in order to improve gait and ADL's.    Time 8    Period Weeks    Status New    Target Date 01/06/21      PT LONG TERM GOAL #5   Title Pt will be able to amb with no assistive device >/= 1000 feet on community level surfaces.    Time 8    Period Weeks    Status New    Target Date 01/06/21                 Plan - 11/11/20 1056    Clinical Impression Statement He had fair to good overall tolerance to session with  focus on flexion ROM and quad strength. He did to appear to have more flexion ROM since eval. Continue POC.    Examination-Activity Limitations Stairs;Squat;Stand;Lift    Examination-Participation Restrictions Other;Occupation;Community Activity;Driving    Stability/Clinical Decision Making Stable/Uncomplicated    Rehab Potential Good    PT Frequency Other (comment)   3x/week and progess to 2x/ week   PT Duration 8 weeks    PT Treatment/Interventions ADLs/Self Care Home Management;Cryotherapy;Electrical Stimulation;Ultrasound;Gait training;Stair training;Functional mobility training;Therapeutic activities;Therapeutic exercise;Balance training;Neuromuscular re-education;Patient/family education;Manual techniques;Passive range of motion;Scar mobilization;Vasopneumatic Device;Taping    PT Next Visit Plan TKA protocol, LE strengtheing, ROM, modalities as needed    PT Home Exercise Plan Access Code: [email or web address]  URL: [email or web address]  Date: 11/09/2020  Prepared by: Narda Amber    Exercises  Long Sitting Quad Set with Towel Roll Under Heel - 3 x daily - 7 x weekly - 2 sets - 10 reps  Supine Active Straight Leg Raise - 3 x daily - 7 x weekly - 2 sets - 10 reps  Supine Heel Slides - 3 x daily - 7 x weekly - 2 sets - 10 reps  Sit to Stand with Counter Support - 3 x daily - 7 x weekly - 2 sets - 10 reps    Consulted and Agree with Plan of Care Patient           Patient will benefit from skilled therapeutic intervention in order to improve the following deficits and impairments:  Pain,Abnormal gait,Difficulty walking,Decreased balance,Impaired flexibility,Increased edema,Decreased activity tolerance,Decreased strength,Decreased range of motion,Decreased mobility  Visit Diagnosis: Acute pain of left knee  Stiffness of left knee, not elsewhere classified  Localized edema  Problem List Patient Active Problem List   Diagnosis Date Noted  . Dizziness of unknown cause 10/25/2020   . S/P total knee arthroplasty, left 10/25/2020  . S/p bilateral revision of total hip replacement 10/25/2020  . Primary osteoarthritis of left knee 04/06/2018  . Seasonal allergic rhinitis due to pollen 04/06/2018  . History of Bell's palsy 04/06/2018    April Manson, PT,DPT 11/11/2020, 10:57 AM      [facility] Physical Therapy [address] [place], [place], [address] Phone: [phone]   Fax:  [phone]  Name: [name] MRN: [id number] Date of Birth: Sep 19, 1970

## 2020-11-14 ENCOUNTER — Encounter: Payer: Self-pay | Admitting: Physical Therapy

## 2020-11-14 ENCOUNTER — Ambulatory Visit (INDEPENDENT_AMBULATORY_CARE_PROVIDER_SITE_OTHER): Payer: BC Managed Care – PPO | Admitting: Physical Therapy

## 2020-11-14 ENCOUNTER — Other Ambulatory Visit: Payer: Self-pay

## 2020-11-14 DIAGNOSIS — M25562 Pain in left knee: Secondary | ICD-10-CM | POA: Diagnosis not present

## 2020-11-14 DIAGNOSIS — M25662 Stiffness of left knee, not elsewhere classified: Secondary | ICD-10-CM

## 2020-11-14 DIAGNOSIS — R600 Localized edema: Secondary | ICD-10-CM

## 2020-11-14 NOTE — Therapy (Signed)
[facility] Physical Therapy [address] [place], [place], [address] Phone: [phone]   Fax:  [phone]  Physical Therapy Treatment  Patient Details  Name: [name] MRN: [id number] Date of Birth: 11/17/1970 Referring Provider (PT): [name], MD   Encounter Date: 11/14/2020   PT End of Session - 11/14/20 0935    Visit Number 3    Number of Visits 20    Date for PT Re-Evaluation 01/06/21    Authorization Type 3x/ week progressing to 2x/ week    PT Start Time 0850    PT Stop Time 0945    PT Time Calculation (min) 55 min    Activity Tolerance Patient tolerated treatment well    Behavior During Therapy [facility] for tasks assessed/performed           Past Medical History:  Diagnosis Date  . Arthritis     Past Surgical History:  Procedure Laterality Date  . NO PAST SURGERIES    . TOTAL KNEE ARTHROPLASTY Left 10/25/2020   Procedure: LEFT TOTAL KNEE ARTHROPLASTY;  Surgeon: Valeria Batman, MD;  Location: [facility];  Service: Orthopedics;  Laterality: Left;    There were no vitals filed for this visit.   Subjective Assessment - 11/14/20 0853    Subjective pain about 3/10 today, still using RW    Pertinent History Left TKA on 10/25/2020.    Limitations Walking;Standing;House hold activities    Diagnostic tests X-ray, hardward intact    Patient Stated Goals Get back to work, bend my knee    Currently in Pain? Yes    Pain Score 3     Pain Location Knee    Pain Orientation Left    Pain Descriptors / Indicators Aching;Sore;Tightness    Pain Type Acute pain;Surgical pain    Pain Onset 1 to 4 weeks ago    Pain Frequency Constant    Aggravating Factors  bending knee    Pain Relieving Factors ice, pain meds, CPM machine                             [facility] Adult PT Treatment/Exercise - 11/14/20 9326      Ambulation/Gait   Gait Comments amb in clinic with [facility] with supervision and no indication of instability or decreased  balance.  Pt safe to amb on level surfaces with SPC at this time      Knee/Hip Exercises: Stretches   Knee: Self-Stretch to increase Flexion --   10x10 sec hold; RLE overpressure in sitting     Knee/Hip Exercises: Aerobic   Nustep L5 X 10 min UE/LE holding 5 sec at end range flexion      Knee/Hip Exercises: Machines for Strengthening   Cybex Leg Press 100# bil push 3x10      Knee/Hip Exercises: Seated   Long Arc Quad Left;2 sets;10 reps    Long Arc Quad Weight 3 lbs.      Vasopneumatic   Number Minutes Vasopneumatic  10 minutes    Vasopnuematic Location  Knee    Vasopneumatic Pressure Medium    Vasopneumatic Temperature  34      Manual Therapy   Manual therapy comments Lt knee flexion in sitting with RLE LAQ 2x10 reps                    PT Short Term Goals - 11/09/20 1021      PT SHORT TERM GOAL #1   Title Pt  will be independent in his HEP.    Time 3    Period Weeks    Status New    Target Date 12/02/20      PT SHORT TERM GOAL #2   Title Pt will be able to amb with least restrictive device on level surfaces for 150 feet safely with pain </= 4/10.    Time 3    Period Weeks    Status New    Target Date 12/02/20             PT Long Term Goals - 11/09/20 1022      PT LONG TERM GOAL #1   Title Pt will improve his FOTO from 47 to >/= 64.    Baseline 47 on 11/09/2020.    Time 8    Period Weeks    Status New    Target Date 01/06/21      PT LONG TERM GOAL #2   Title Pt will be able to perform left knee flexion to >= 120 degrees.    Time 8    Period Weeks    Status New    Target Date 01/06/21      PT LONG TERM GOAL #3   Title Pt will be able to amb up and down 5 steps  with no hand rail with pain </= 2/10 in order to get in and out of pt's home safely.    Time 8    Period Weeks    Status New    Target Date 01/06/21      PT LONG TERM GOAL #4   Title Pt will be able to improve his left LE strength to grossly 5/5 in order to improve gait and ADL's.     Time 8    Period Weeks    Status New    Target Date 01/06/21      PT LONG TERM GOAL #5   Title Pt will be able to amb with no assistive device >/= 1000 feet on community level surfaces.    Time 8    Period Weeks    Status New    Target Date 01/06/21                 Plan - 11/14/20 1104    Clinical Impression Statement Pt demonstrated safe amb in clinic with [facility] today without and unsteadiness or instability noted.  Feel his is safe to transition to [facility] for level surfaces at this time.  Will continue to benefit from PT to maximize function.    Examination-Activity Limitations Stairs;Squat;Stand;Lift    Examination-Participation Restrictions Other;Occupation;Community Activity;Driving    Stability/Clinical Decision Making Stable/Uncomplicated    Rehab Potential Good    PT Frequency Other (comment)   3x/week and progess to 2x/ week   PT Duration 8 weeks    PT Treatment/Interventions ADLs/Self Care Home Management;Cryotherapy;Electrical Stimulation;Ultrasound;Gait training;Stair training;Functional mobility training;Therapeutic activities;Therapeutic exercise;Balance training;Neuromuscular re-education;Patient/family education;Manual techniques;Passive range of motion;Scar mobilization;Vasopneumatic Device;Taping    PT Next Visit Plan TKA protocol, LE strengtheing, ROM, modalities as needed; gait with cane, measure flexion    PT Home Exercise Plan Access Code: [email or web address]  URL: [email or web address]  Date: 11/09/2020  Prepared by: Narda Amber    Exercises  Long Sitting Quad Set with Towel Roll Under Heel - 3 x daily - 7 x weekly - 2 sets - 10 reps  Supine Active Straight Leg Raise - 3 x daily - 7 x weekly - 2 sets - 10 reps  Supine Heel  Slides - 3 x daily - 7 x weekly - 2 sets - 10 reps  Sit to Stand with Counter Support - 3 x daily - 7 x weekly - 2 sets - 10 reps    Consulted and Agree with Plan of Care Patient           Patient will benefit from skilled  therapeutic intervention in order to improve the following deficits and impairments:  Pain,Abnormal gait,Difficulty walking,Decreased balance,Impaired flexibility,Increased edema,Decreased activity tolerance,Decreased strength,Decreased range of motion,Decreased mobility  Visit Diagnosis: Acute pain of left knee  Stiffness of left knee, not elsewhere classified  Localized edema     Problem List Patient Active Problem List   Diagnosis Date Noted  . Dizziness of unknown cause 10/25/2020  . S/P total knee arthroplasty, left 10/25/2020  . S/p bilateral revision of total hip replacement 10/25/2020  . Primary osteoarthritis of left knee 04/06/2018  . Seasonal allergic rhinitis due to pollen 04/06/2018  . History of Bell's palsy 04/06/2018        Clarita Crane, PT, DPT 11/14/20 11:09 AM     [facility] Physical Therapy [address] [place], [place], [address] Phone: [phone]   Fax:  [phone]  Name: [name] MRN: [id number] Date of Birth: November 04, 1970

## 2020-11-15 ENCOUNTER — Encounter: Payer: Self-pay | Admitting: Physical Therapy

## 2020-11-15 ENCOUNTER — Ambulatory Visit (INDEPENDENT_AMBULATORY_CARE_PROVIDER_SITE_OTHER): Payer: BC Managed Care – PPO | Admitting: Physical Therapy

## 2020-11-15 DIAGNOSIS — M25662 Stiffness of left knee, not elsewhere classified: Secondary | ICD-10-CM | POA: Diagnosis not present

## 2020-11-15 DIAGNOSIS — R600 Localized edema: Secondary | ICD-10-CM

## 2020-11-15 DIAGNOSIS — M25562 Pain in left knee: Secondary | ICD-10-CM

## 2020-11-15 NOTE — Therapy (Signed)
[facility] Physical Therapy [address] [place], [place], [address] Phone: [phone]   Fax:  [phone]  Physical Therapy Treatment  Patient Details  Name: [name] MRN: [id number] Date of Birth: 28-May-1971 Referring Provider (PT): [name] MD   Encounter Date: 11/15/2020   PT End of Session - 11/15/20 0810    Visit Number 4    Number of Visits 20    Date for PT Re-Evaluation 01/06/21    Authorization Type 3x/ week progressing to 2x/ week    PT Start Time 0806    PT Stop Time 0850    PT Time Calculation (min) 44 min    Equipment Utilized During Treatment Gait belt    Activity Tolerance Patient tolerated treatment well    Behavior During Therapy [facility] for tasks assessed/performed           Past Medical History:  Diagnosis Date  . Arthritis     Past Surgical History:  Procedure Laterality Date  . NO PAST SURGERIES    . TOTAL KNEE ARTHROPLASTY Left 10/25/2020   Procedure: LEFT TOTAL KNEE ARTHROPLASTY;  Surgeon: Valeria Batman, MD;  Location: [facility];  Service: Orthopedics;  Laterality: Left;    There were no vitals filed for this visit.   Subjective Assessment - 11/15/20 0808    Subjective Pt arriving today reporting 3/10 pain today. Pt amb into clinic with straight cane.    Pertinent History Left TKA on 10/25/2020.    Limitations Walking;Standing;House hold activities    Diagnostic tests X-ray, hardward intact    Currently in Pain? Yes    Pain Score 3     Pain Location Knee    Pain Orientation Left    Pain Descriptors / Indicators Aching;Sore    Pain Onset 1 to 4 weeks ago              [facility] PT Assessment - 11/15/20 0001      Assessment   Medical Diagnosis s/p L KA    Referring Provider (PT) [name] MD      AROM   Overall AROM Comments measured in sitting at edge of mat table    Right/Left Knee Left    Left Knee Extension 4    Left Knee Flexion 90      PROM   Overall PROM Comments measured in sitting  at edge of mat table    PROM Assessment Site Knee    Right/Left Knee Left    Left Knee Extension 0    Left Knee Flexion 98                         [facility] Adult PT Treatment/Exercise - 11/15/20 0001      Ambulation/Gait   Gait Comments Pt amb with straight cane on level surfaces      Knee/Hip Exercises: Aerobic   Recumbent Bike Rocking back and forth x 6 minutes100      Knee/Hip Exercises: Machines for Strengthening   Cybex Leg Press 100# bil push 2x15, LLE only 31#      Knee/Hip Exercises: Seated   Long Arc Quad Left;2 sets;10 reps    Hamstring Curl Strengthening;Left;2 sets;10 reps    Hamstring Limitations green      Modalities   Modalities Vasopneumatic      Vasopneumatic   Number Minutes Vasopneumatic  10 minutes    Vasopnuematic Location  Knee    Vasopneumatic Pressure Medium    Vasopneumatic Temperature  34  Manual Therapy   Manual therapy comments tailgait knee flexion with R LE LAQ 2 x 10                    PT Short Term Goals - 11/15/20 0813      PT SHORT TERM GOAL #1   Title Pt will be independent in his HEP.    Status On-going      PT SHORT TERM GOAL #2   Title Pt will be able to amb with least restrictive device on level surfaces for 150 feet safely with pain </= 4/10.    Status On-going             PT Long Term Goals - 11/09/20 1022      PT LONG TERM GOAL #1   Title Pt will improve his FOTO from 47 to >/= 64.    Baseline 47 on 11/09/2020.    Time 8    Period Weeks    Status New    Target Date 01/06/21      PT LONG TERM GOAL #2   Title Pt will be able to perform left knee flexion to >= 120 degrees.    Time 8    Period Weeks    Status New    Target Date 01/06/21      PT LONG TERM GOAL #3   Title Pt will be able to amb up and down 5 steps  with no hand rail with pain </= 2/10 in order to get in and out of pt's home safely.    Time 8    Period Weeks    Status New    Target Date 01/06/21      PT LONG TERM  GOAL #4   Title Pt will be able to improve his left LE strength to grossly 5/5 in order to improve gait and ADL's.    Time 8    Period Weeks    Status New    Target Date 01/06/21      PT LONG TERM GOAL #5   Title Pt will be able to amb with no assistive device >/= 1000 feet on community level surfaces.    Time 8    Period Weeks    Status New    Target Date 01/06/21                 Plan - 11/15/20 0810    Clinical Impression Statement Pt tolerating exercises well progressing with strength and ROM. Pt amb today using straight cane. Pt progressing to using the recumbent bike with rocking mechanism due to limited ROM. PROM measured today 0-98 degrees in sitting at edge of mat table. Continue skilled PT to maximize function.    Examination-Activity Limitations Stairs;Squat;Stand;Lift    Examination-Participation Restrictions Other;Occupation;Community Activity;Driving    Stability/Clinical Decision Making Stable/Uncomplicated    Rehab Potential Good    PT Frequency Other (comment)    PT Treatment/Interventions ADLs/Self Care Home Management;Cryotherapy;Electrical Stimulation;Ultrasound;Gait training;Stair training;Functional mobility training;Therapeutic activities;Therapeutic exercise;Balance training;Neuromuscular re-education;Patient/family education;Manual techniques;Passive range of motion;Scar mobilization;Vasopneumatic Device;Taping    PT Next Visit Plan TKA protocol, LE strengtheing, ROM, modalities as needed; gait with cane, measure flexion    PT Home Exercise Plan Access Code: [email or web address]  URL: [email or web address]  Date: 11/09/2020  Prepared by: Narda Amber    Exercises  Long Sitting Quad Set with Towel Roll Under Heel - 3 x daily - 7 x weekly - 2 sets - 10 reps  Supine Active Straight Leg  Raise - 3 x daily - 7 x weekly - 2 sets - 10 reps  Supine Heel Slides - 3 x daily - 7 x weekly - 2 sets - 10 reps  Sit to Stand with Counter Support - 3 x daily - 7 x weekly -  2 sets - 10 reps    Consulted and Agree with Plan of Care Patient           Patient will benefit from skilled therapeutic intervention in order to improve the following deficits and impairments:  Pain,Abnormal gait,Difficulty walking,Decreased balance,Impaired flexibility,Increased edema,Decreased activity tolerance,Decreased strength,Decreased range of motion,Decreased mobility  Visit Diagnosis: Acute pain of left knee  Stiffness of left knee, not elsewhere classified  Localized edema     Problem List Patient Active Problem List   Diagnosis Date Noted  . Dizziness of unknown cause 10/25/2020  . S/P total knee arthroplasty, left 10/25/2020  . S/p bilateral revision of total hip replacement 10/25/2020  . Primary osteoarthritis of left knee 04/06/2018  . Seasonal allergic rhinitis due to pollen 04/06/2018  . History of Bell's palsy 04/06/2018    Sharmon Leyden, PT, MPT 11/15/2020, 8:41 AM  [facility] Physical Therapy [address] [place], [place], [address] Phone: [phone]   Fax:  [phone]  Name: [name] MRN: [id number] Date of Birth: [birth year]

## 2020-11-16 ENCOUNTER — Ambulatory Visit (INDEPENDENT_AMBULATORY_CARE_PROVIDER_SITE_OTHER): Payer: BC Managed Care – PPO | Admitting: Physical Therapy

## 2020-11-16 ENCOUNTER — Encounter: Payer: Self-pay | Admitting: Physical Therapy

## 2020-11-16 ENCOUNTER — Other Ambulatory Visit: Payer: Self-pay

## 2020-11-16 DIAGNOSIS — M25662 Stiffness of left knee, not elsewhere classified: Secondary | ICD-10-CM | POA: Diagnosis not present

## 2020-11-16 DIAGNOSIS — M25562 Pain in left knee: Secondary | ICD-10-CM | POA: Diagnosis not present

## 2020-11-16 DIAGNOSIS — R600 Localized edema: Secondary | ICD-10-CM | POA: Diagnosis not present

## 2020-11-16 NOTE — Therapy (Signed)
[facility] Physical Therapy [address] [place], [place], [address] Phone: [phone]   Fax:  [phone]  Physical Therapy Treatment  Patient Details  Name: [name] MRN: [id number] Date of Birth: Jan 13, 1971 Referring Provider (PT): [name], MD   Encounter Date: 11/16/2020   PT End of Session - 11/16/20 0818    Visit Number 5    Number of Visits 20    Date for PT Re-Evaluation 01/06/21    Authorization Type 3x/ week progressing to 2x/ week    PT Start Time 0814    PT Stop Time 0858    PT Time Calculation (min) 44 min    Equipment Utilized During Treatment Gait belt    Activity Tolerance Patient tolerated treatment well    Behavior During Therapy [facility] for tasks assessed/performed           Past Medical History:  Diagnosis Date  . Arthritis     Past Surgical History:  Procedure Laterality Date  . NO PAST SURGERIES    . TOTAL KNEE ARTHROPLASTY Left 10/25/2020   Procedure: LEFT TOTAL KNEE ARTHROPLASTY;  Surgeon: Valeria Batman, MD;  Location: [facility];  Service: Orthopedics;  Laterality: Left;    There were no vitals filed for this visit.   Subjective Assessment - 11/16/20 0817    Subjective Pt reporting 2/10 today in left knee.    Pertinent History Left TKA on 10/25/2020.    Limitations Walking;Standing;House hold activities    Diagnostic tests X-ray, hardward intact    Patient Stated Goals Get back to work, bend my knee    Currently in Pain? Yes    Pain Score 2     Pain Location Knee    Pain Orientation Left    Pain Descriptors / Indicators Sore    Pain Type Surgical pain;Acute pain    Pain Onset 1 to 4 weeks ago    Pain Frequency Intermittent              [facility] PT Assessment - 11/16/20 0001      Assessment   Medical Diagnosis s/p L TKA    Referring Provider (PT) [name], MD    Onset Date/Surgical Date 10/25/20      AROM   Overall AROM Comments measured in supine    AROM Assessment Site Knee     Right/Left Knee Left    Left Knee Extension 4    Left Knee Flexion 88      PROM   Overall PROM Comments measured in supine, pt with increased edema noted today in knee and ankle    PROM Assessment Site Knee    Right/Left Knee Left    Left Knee Extension 2    Left Knee Flexion 90      Ambulation/Gait   Gait Comments Pt amb with straight cane on level surfaces                         [facility] Adult PT Treatment/Exercise - 11/16/20 0001      Knee/Hip Exercises: Aerobic   Recumbent Bike Rocking back and forth x 8 minutes100      Knee/Hip Exercises: Machines for Strengthening   Cybex Leg Press 100# bilateral LE's 3x 10, L LE only: 31# x 10      Knee/Hip Exercises: Seated   Long Arc Quad Left;2 sets;10 reps      Knee/Hip Exercises: Supine   Quad Sets Left;20 reps;Limitations    Straight Leg Raises  Strengthening;Left;2 sets;10 reps      Modalities   Modalities Vasopneumatic      Vasopneumatic   Number Minutes Vasopneumatic  15 minutes    Vasopnuematic Location  Knee    Vasopneumatic Pressure Medium    Vasopneumatic Temperature  34      Manual Therapy   Manual therapy comments supine: PROM knee flexion and extension, STM around incision site, and lateral knee                    PT Short Term Goals - 11/16/20 0818      PT SHORT TERM GOAL #1   Title Pt will be independent in his HEP.    Status On-going      PT SHORT TERM GOAL #2   Title Pt will be able to amb with least restrictive device on level surfaces for 150 feet safely with pain </= 4/10.    Status On-going             PT Long Term Goals - 11/16/20 0819      PT LONG TERM GOAL #1   Title Pt will improve his FOTO from 47 to >/= 64.    Status On-going      PT LONG TERM GOAL #2   Title Pt will be able to perform left knee flexion to >= 120 degrees.    Status On-going      PT LONG TERM GOAL #3   Title Pt will be able to amb up and down 5 steps  with no hand rail with pain </= 2/10 in  order to get in and out of pt's home safely.    Status On-going      PT LONG TERM GOAL #4   Title Pt will be able to improve his left LE strength to grossly 5/5 in order to improve gait and ADL's.    Status On-going      PT LONG TERM GOAL #5   Status On-going                 Plan - 11/16/20 0846    Clinical Impression Statement Pt arriving today in 2/10 pain in his left knee. Pt amb with straight cane. Pt continuing to progress with rocking back and forth on recumbent bike. Following leg press activities pt reporting increased pain in L medial knee. Pt AROM measured today 4-88 with increased swelling/edema noted. Pt stated he did not take his pain meds prior to therapy. Pt was advised to contiue to ice and elevate throughout the day and take his pain meds when needed. Continue skilled PT.    Examination-Activity Limitations Stairs;Squat;Stand;Lift    Examination-Participation Restrictions Other;Occupation;Community Activity;Driving    Stability/Clinical Decision Making Stable/Uncomplicated    Rehab Potential Good    PT Frequency Other (comment)    PT Duration 8 weeks    PT Treatment/Interventions ADLs/Self Care Home Management;Cryotherapy;Electrical Stimulation;Ultrasound;Gait training;Stair training;Functional mobility training;Therapeutic activities;Therapeutic exercise;Balance training;Neuromuscular re-education;Patient/family education;Manual techniques;Passive range of motion;Scar mobilization;Vasopneumatic Device;Taping    PT Next Visit Plan TKA protocol, LE strengtheing, ROM, modalities as needed; gait with cane, measure flexion    PT Home Exercise Plan Access Code: [email or web address]  URL: [email or web address]  Date: 11/09/2020  Prepared by: Narda Amber    Exercises  Long Sitting Quad Set with Towel Roll Under Heel - 3 x daily - 7 x weekly - 2 sets - 10 reps  Supine Active Straight Leg Raise - 3 x daily - 7 x weekly - 2  sets - 10 reps  Supine Heel Slides - 3 x daily - 7  x weekly - 2 sets - 10 reps  Sit to Stand with Counter Support - 3 x daily - 7 x weekly - 2 sets - 10 reps    Consulted and Agree with Plan of Care Patient           Patient will benefit from skilled therapeutic intervention in order to improve the following deficits and impairments:  Pain,Abnormal gait,Difficulty walking,Decreased balance,Impaired flexibility,Increased edema,Decreased activity tolerance,Decreased strength,Decreased range of motion,Decreased mobility  Visit Diagnosis: Acute pain of left knee  Stiffness of left knee, not elsewhere classified  Localized edema     Problem List Patient Active Problem List   Diagnosis Date Noted  . Dizziness of unknown cause 10/25/2020  . S/P total knee arthroplasty, left 10/25/2020  . S/p bilateral revision of total hip replacement 10/25/2020  . Primary osteoarthritis of left knee 04/06/2018  . Seasonal allergic rhinitis due to pollen 04/06/2018  . History of Bell's palsy 04/06/2018    Sharmon Leyden PT, MPT 11/16/2020, 9:08 AM  [facility] Physical Therapy [address] [place], [place], [address] Phone: [phone]   Fax:  [phone]  Name: [name] MRN: [id number] Date of Birth: 15-Aug-1971

## 2020-11-17 ENCOUNTER — Other Ambulatory Visit: Payer: Self-pay | Admitting: Surgical

## 2020-11-17 NOTE — Telephone Encounter (Signed)
Please advise 

## 2020-11-21 ENCOUNTER — Ambulatory Visit (INDEPENDENT_AMBULATORY_CARE_PROVIDER_SITE_OTHER): Payer: BC Managed Care – PPO | Admitting: Physical Therapy

## 2020-11-21 ENCOUNTER — Other Ambulatory Visit: Payer: Self-pay

## 2020-11-21 DIAGNOSIS — M25662 Stiffness of left knee, not elsewhere classified: Secondary | ICD-10-CM | POA: Diagnosis not present

## 2020-11-21 DIAGNOSIS — M25562 Pain in left knee: Secondary | ICD-10-CM

## 2020-11-21 DIAGNOSIS — R600 Localized edema: Secondary | ICD-10-CM

## 2020-11-21 NOTE — Therapy (Signed)
[facility] Physical Therapy [address] [place], [place], [address] Phone: [phone]   Fax:  [phone]  Physical Therapy Treatment  Patient Details  Name: [name] MRN: [id number] Date of Birth: June 18, 1971 Referring Provider (PT): [name], MD   Encounter Date: 11/21/2020   PT End of Session - 11/21/20 0923    Visit Number 6    Number of Visits 20    Date for PT Re-Evaluation 01/06/21    Authorization Type 3x/ week progressing to 2x/ week    PT Start Time 0847    PT Stop Time 0933    PT Time Calculation (min) 46 min    Equipment Utilized During Treatment Gait belt    Activity Tolerance Patient tolerated treatment well    Behavior During Therapy [facility] for tasks assessed/performed           Past Medical History:  Diagnosis Date  . Arthritis     Past Surgical History:  Procedure Laterality Date  . NO PAST SURGERIES    . TOTAL KNEE ARTHROPLASTY Left 10/25/2020   Procedure: LEFT TOTAL KNEE ARTHROPLASTY;  Surgeon: Valeria Batman, MD;  Location: [facility];  Service: Orthopedics;  Laterality: Left;    There were no vitals filed for this visit.   Subjective Assessment - 11/21/20 0854    Subjective relays his Lt knee still hurts a litte about 2-3 out of 10 and still has stiffness    Pertinent History Left TKA on 10/25/2020.    Limitations Walking;Standing;House hold activities    Diagnostic tests X-ray, hardward intact    Patient Stated Goals Get back to work, bend my knee    Pain Onset 1 to 4 weeks ago             [facility] Adult PT Treatment/Exercise - 11/21/20 0001      Knee/Hip Exercises: Stretches   Active Hamstring Stretch Left;2 reps;30 seconds    Active Hamstring Stretch Limitations seated    Knee: Self-Stretch Limitations seated tailgate stretch for flexion with self overpressure 10 sec X 3 min      Knee/Hip Exercises: Aerobic   Recumbent Bike rocking for ROM, 8 minutes      Knee/Hip Exercises: Machines for Strengthening    Cybex Leg Press --      Knee/Hip Exercises: Standing   Other Standing Knee Exercises TRx squats 2 sets of 10      Knee/Hip Exercises: Seated   Long Arc Quad Left;3 sets;10 reps    Long Arc Quad Weight 5 lbs.    Hamstring Curl Strengthening;Left;2 sets;10 reps    Hamstring Limitations blue      Vasopneumatic   Number Minutes Vasopneumatic  10 minutes    Vasopnuematic Location  Knee    Vasopneumatic Pressure Medium    Vasopneumatic Temperature  34      Manual Therapy   Manual therapy comments Lt knee PROM, flexion mobs                    PT Short Term Goals - 11/21/20 0925      PT SHORT TERM GOAL #1   Title Pt will be independent in his HEP.    Status On-going      PT SHORT TERM GOAL #2   Title Pt will be able to amb with least restrictive device on level surfaces for 150 feet safely with pain </= 4/10.    Status Achieved             PT  Long Term Goals - 11/16/20 0819      PT LONG TERM GOAL #1   Title Pt will improve his FOTO from 47 to >/= 64.    Status On-going      PT LONG TERM GOAL #2   Title Pt will be able to perform left knee flexion to >= 120 degrees.    Status On-going      PT LONG TERM GOAL #3   Title Pt will be able to amb up and down 5 steps  with no hand rail with pain </= 2/10 in order to get in and out of pt's home safely.    Status On-going      PT LONG TERM GOAL #4   Title Pt will be able to improve his left LE strength to grossly 5/5 in order to improve gait and ADL's.    Status On-going      PT LONG TERM GOAL #5   Status On-going                 Plan - 11/21/20 0924    Clinical Impression Statement Continued to work to improve Lt knee flexion ROM and overall strength as tolerated. He continues to lack flexion ROM and PT will continue to progress this as much as tolerated.    Examination-Activity Limitations Stairs;Squat;Stand;Lift    Examination-Participation Restrictions Other;Occupation;Community Activity;Driving     Stability/Clinical Decision Making Stable/Uncomplicated    Rehab Potential Good    PT Frequency Other (comment)    PT Duration 8 weeks    PT Treatment/Interventions ADLs/Self Care Home Management;Cryotherapy;Electrical Stimulation;Ultrasound;Gait training;Stair training;Functional mobility training;Therapeutic activities;Therapeutic exercise;Balance training;Neuromuscular re-education;Patient/family education;Manual techniques;Passive range of motion;Scar mobilization;Vasopneumatic Device;Taping    PT Next Visit Plan TKA protocol, LE strengtheing, ROM, modalities as needed; gait with cane, measure flexion    PT Home Exercise Plan Access Code: [email or web address]  URL: [email or web address]  Date: 11/09/2020  Prepared by: Narda Amber    Exercises  Long Sitting Quad Set with Towel Roll Under Heel - 3 x daily - 7 x weekly - 2 sets - 10 reps  Supine Active Straight Leg Raise - 3 x daily - 7 x weekly - 2 sets - 10 reps  Supine Heel Slides - 3 x daily - 7 x weekly - 2 sets - 10 reps  Sit to Stand with Counter Support - 3 x daily - 7 x weekly - 2 sets - 10 reps    Consulted and Agree with Plan of Care Patient           Patient will benefit from skilled therapeutic intervention in order to improve the following deficits and impairments:  Pain,Abnormal gait,Difficulty walking,Decreased balance,Impaired flexibility,Increased edema,Decreased activity tolerance,Decreased strength,Decreased range of motion,Decreased mobility  Visit Diagnosis: Acute pain of left knee  Stiffness of left knee, not elsewhere classified  Localized edema     Problem List Patient Active Problem List   Diagnosis Date Noted  . Dizziness of unknown cause 10/25/2020  . S/P total knee arthroplasty, left 10/25/2020  . S/p bilateral revision of total hip replacement 10/25/2020  . Primary osteoarthritis of left knee 04/06/2018  . Seasonal allergic rhinitis due to pollen 04/06/2018  . History of Bell's palsy 04/06/2018     Birdie Riddle 11/21/2020, 9:26 AM  [facility] Physical Therapy [address] [place], [place], [address] Phone: [phone]   Fax:  [phone]  Name: [name] MRN: [id number] Date of Birth: [birth year]

## 2020-11-23 ENCOUNTER — Ambulatory Visit (INDEPENDENT_AMBULATORY_CARE_PROVIDER_SITE_OTHER): Payer: BC Managed Care – PPO | Admitting: Physical Therapy

## 2020-11-23 ENCOUNTER — Encounter: Payer: Self-pay | Admitting: Physical Therapy

## 2020-11-23 ENCOUNTER — Other Ambulatory Visit: Payer: Self-pay

## 2020-11-23 DIAGNOSIS — M25662 Stiffness of left knee, not elsewhere classified: Secondary | ICD-10-CM

## 2020-11-23 DIAGNOSIS — M25562 Pain in left knee: Secondary | ICD-10-CM

## 2020-11-23 DIAGNOSIS — R600 Localized edema: Secondary | ICD-10-CM | POA: Diagnosis not present

## 2020-11-23 NOTE — Therapy (Signed)
[facility] Physical Therapy [address] [place], [place], [address] Phone: [phone]   Fax:  [phone]  Physical Therapy Treatment  Patient Details  Name: [name] MRN: [id number] Date of Birth: July 28, 1971 Referring Provider (PT): [name], MD   Encounter Date: 11/23/2020   PT End of Session - 11/23/20 0940    Visit Number 7    Number of Visits 20    Date for PT Re-Evaluation 01/06/21    Authorization Type 3x/ week progressing to 2x/ week    PT Start Time 0932    PT Stop Time 1020    PT Time Calculation (min) 48 min    Activity Tolerance Patient tolerated treatment well    Behavior During Therapy [facility] for tasks assessed/performed           Past Medical History:  Diagnosis Date  . Arthritis     Past Surgical History:  Procedure Laterality Date  . NO PAST SURGERIES    . TOTAL KNEE ARTHROPLASTY Left 10/25/2020   Procedure: LEFT TOTAL KNEE ARTHROPLASTY;  Surgeon: Valeria Batman, MD;  Location: [facility];  Service: Orthopedics;  Laterality: Left;    There were no vitals filed for this visit.   Subjective Assessment - 11/23/20 0939    Subjective Pt reporting 1/10 pain in left knee upon arrival. Stiffness reported.    Pertinent History Left TKA on 10/25/2020.    Limitations Walking;Standing;House hold activities    Diagnostic tests X-ray, hardward intact    Patient Stated Goals Get back to work, bend my knee    Currently in Pain? Yes    Pain Score 1     Pain Location Knee    Pain Orientation Left    Pain Descriptors / Indicators Aching;Tightness    Pain Type Surgical pain    Pain Onset More than a month ago              [facility] PT Assessment - 11/23/20 0001      Assessment   Medical Diagnosis s/p L TKA    Referring Provider (PT) [name], MD    Onset Date/Surgical Date 10/25/20      AROM   Overall AROM Comments measured in supine    AROM Assessment Site Knee    Right/Left Knee Left    Left Knee Extension 4     Left Knee Flexion 86      PROM   PROM Assessment Site Knee    Right/Left Knee Left    Left Knee Extension 2    Left Knee Flexion 92                         [facility] Adult PT Treatment/Exercise - 11/23/20 0001      Knee/Hip Exercises: Stretches   Active Hamstring Stretch Left;2 reps;30 seconds    Active Hamstring Stretch Limitations seated    Knee: Self-Stretch Limitations seated tailgate stretch for flexion with self overpressure 10 sec X 3 min      Knee/Hip Exercises: Aerobic   Recumbent Bike rocking for ROM, 8 minutes      Knee/Hip Exercises: Standing   Other Standing Knee Exercises TRx squats 2 sets of 10      Knee/Hip Exercises: Seated   Long Arc Quad Left;3 sets;10 reps    Long Arc Quad Weight 5 lbs.    Hamstring Curl Strengthening;Left;2 sets;10 reps    Hamstring Limitations using strap to pull and hold 10 seconds each x 10  Vasopneumatic   Number Minutes Vasopneumatic  10 minutes    Vasopnuematic Location  Knee    Vasopneumatic Pressure Medium    Vasopneumatic Temperature  34      Manual Therapy   Manual therapy comments L knee PROM, flexion and extension with over pressure                    PT Short Term Goals - 11/21/20 0925      PT SHORT TERM GOAL #1   Title Pt will be independent in his HEP.    Status On-going      PT SHORT TERM GOAL #2   Title Pt will be able to amb with least restrictive device on level surfaces for 150 feet safely with pain </= 4/10.    Status Achieved             PT Long Term Goals - 11/16/20 0819      PT LONG TERM GOAL #1   Title Pt will improve his FOTO from 47 to >/= 64.    Status On-going      PT LONG TERM GOAL #2   Title Pt will be able to perform left knee flexion to >= 120 degrees.    Status On-going      PT LONG TERM GOAL #3   Title Pt will be able to amb up and down 5 steps  with no hand rail with pain </= 2/10 in order to get in and out of pt's home safely.    Status On-going       PT LONG TERM GOAL #4   Title Pt will be able to improve his left LE strength to grossly 5/5 in order to improve gait and ADL's.    Status On-going      PT LONG TERM GOAL #5   Status On-going                 Plan - 11/23/20 0941    Clinical Impression Statement Pt tolerating exercises well with focusing on left knee flexion and strengthening. Pt reporting 1/10 pain upon arrival, pain increased to 4/10 with step ups but declined by end of session. Pt encouraged to ice, elevate and stretch at home. Pt compliant with his HEP. Continue skille PT to progress toward pt's PLOF.    Examination-Activity Limitations Stairs;Squat;Stand;Lift    Examination-Participation Restrictions Other;Occupation;Community Activity;Driving    Stability/Clinical Decision Making Stable/Uncomplicated    Rehab Potential Good    PT Frequency Other (comment)    PT Duration 8 weeks    PT Treatment/Interventions ADLs/Self Care Home Management;Cryotherapy;Electrical Stimulation;Ultrasound;Gait training;Stair training;Functional mobility training;Therapeutic activities;Therapeutic exercise;Balance training;Neuromuscular re-education;Patient/family education;Manual techniques;Passive range of motion;Scar mobilization;Vasopneumatic Device;Taping    PT Next Visit Plan TKA protocol, LE strengtheing, ROM, modalities as needed; gait with cane, measure flexion    PT Home Exercise Plan Access Code: [email or web address]  URL: [email or web address]  Date: 11/09/2020  Prepared by: Narda Amber    Exercises  Long Sitting Quad Set with Towel Roll Under Heel - 3 x daily - 7 x weekly - 2 sets - 10 reps  Supine Active Straight Leg Raise - 3 x daily - 7 x weekly - 2 sets - 10 reps  Supine Heel Slides - 3 x daily - 7 x weekly - 2 sets - 10 reps  Sit to Stand with Counter Support - 3 x daily - 7 x weekly - 2 sets - 10 reps    Consulted and Agree with Plan  of Care Patient           Patient will benefit from skilled therapeutic  intervention in order to improve the following deficits and impairments:  Pain,Abnormal gait,Difficulty walking,Decreased balance,Impaired flexibility,Increased edema,Decreased activity tolerance,Decreased strength,Decreased range of motion,Decreased mobility  Visit Diagnosis: Acute pain of left knee  Stiffness of left knee, not elsewhere classified  Localized edema     Problem List Patient Active Problem List   Diagnosis Date Noted  . Dizziness of unknown cause 10/25/2020  . S/P total knee arthroplasty, left 10/25/2020  . S/p bilateral revision of total hip replacement 10/25/2020  . Primary osteoarthritis of left knee 04/06/2018  . Seasonal allergic rhinitis due to pollen 04/06/2018  . History of Bell's palsy 04/06/2018    Sharmon Leyden, PT, MPT 11/23/2020, 10:09 AM  [facility] Physical Therapy [address] [place], [place], [address] Phone: [phone]   Fax:  [phone]  Name: [name] MRN: [id number] Date of Birth: Aug 11, 1971

## 2020-11-24 ENCOUNTER — Encounter: Payer: Self-pay | Admitting: Orthopaedic Surgery

## 2020-11-24 ENCOUNTER — Ambulatory Visit (INDEPENDENT_AMBULATORY_CARE_PROVIDER_SITE_OTHER): Payer: BC Managed Care – PPO | Admitting: Orthopaedic Surgery

## 2020-11-24 VITALS — Ht 70.0 in | Wt 239.0 lb

## 2020-11-24 DIAGNOSIS — Z96652 Presence of left artificial knee joint: Secondary | ICD-10-CM

## 2020-11-24 NOTE — Progress Notes (Signed)
   Office Visit Note   Patient: [name]           Date of Birth: Mar 23, 1971           MRN: [id number] Visit Date: 11/24/2020              Requested by: Valeria Batman, MD [address] [place],  [place] [address] PCP: Jac Canavan, PA-C   Assessment & Plan: Visit Diagnoses:  1. S/P total knee arthroplasty, left     Plan: 1 month status post primary left total knee replacement doing very well.  Uses a cane.  Not using any pain medicine.  Continues to be followed in therapy.  We will check him again in a month.  Probably unable to work for at least another month  Follow-Up Instructions: Return in about 1 month (around 12/25/2020).   Orders:  No orders of the defined types were placed in this encounter.  No orders of the defined types were placed in this encounter.     Procedures: No procedures performed   Clinical Data: No additional findings.   Subjective: Chief Complaint  Patient presents with  . Left Knee - Follow-up    Left total knee arthroplasty 10/25/2020  Patient presents today for follow up on his left knee. He had a left total knee arthroplasty on 10/25/2020. He is now one month out from surgery. He is doing well. He goes to physical therapy twice weekly. He takes over the counter medicine as needed.   HPI  Review of Systems   Objective: Vital Signs: Ht 5\' 10"  (1.778 m)   Wt 239 lb (108.4 kg)   BMI 34.29 kg/m   Physical Exam  Ortho Exam left knee with full extension and flexion about 104 degrees.  No instability.  Wound is healing nicely.  No calf pain.  Some hypertrophic change but nothing unusual neurologically intact.  Specialty Comments:  No specialty comments available.  Imaging: No results found.   PMFS History: Patient Active Problem List   Diagnosis Date Noted  . Dizziness of unknown cause 10/25/2020  . S/P total knee arthroplasty, left 10/25/2020  . S/p bilateral revision of total hip replacement 10/25/2020  . Primary  osteoarthritis of left knee 04/06/2018  . Seasonal allergic rhinitis due to pollen 04/06/2018  . History of Bell's palsy 04/06/2018   Past Medical History:  Diagnosis Date  . Arthritis     Family History  Problem Relation Age of Onset  . Hypertension Mother   . Prostate cancer Father   . Diabetes Sister     Past Surgical History:  Procedure Laterality Date  . NO PAST SURGERIES    . TOTAL KNEE ARTHROPLASTY Left 10/25/2020   Procedure: LEFT TOTAL KNEE ARTHROPLASTY;  Surgeon: 12/23/2020, MD;  Location: [facility];  Service: Orthopedics;  Laterality: Left;   Social History   Occupational History  . Not on file  Tobacco Use  . Smoking status: Never Smoker  . Smokeless tobacco: Never Used  Vaping Use  . Vaping Use: Never used  Substance and Sexual Activity  . Alcohol use: No  . Drug use: No  . Sexual activity: Not on file

## 2020-11-25 ENCOUNTER — Other Ambulatory Visit: Payer: Self-pay

## 2020-11-25 ENCOUNTER — Ambulatory Visit (INDEPENDENT_AMBULATORY_CARE_PROVIDER_SITE_OTHER): Payer: BC Managed Care – PPO | Admitting: Physical Therapy

## 2020-11-25 DIAGNOSIS — M25662 Stiffness of left knee, not elsewhere classified: Secondary | ICD-10-CM | POA: Diagnosis not present

## 2020-11-25 DIAGNOSIS — M25562 Pain in left knee: Secondary | ICD-10-CM

## 2020-11-25 DIAGNOSIS — R600 Localized edema: Secondary | ICD-10-CM

## 2020-11-25 NOTE — Therapy (Signed)
[facility] Physical Therapy [address] [place], [place], [address] Phone: [phone]   Fax:  [phone]  Physical Therapy Treatment  Patient Details  Name: [name] MRN: [id number] Date of Birth: 1971-05-25 Referring Provider (PT): [name], MD   Encounter Date: 11/25/2020   PT End of Session - 11/25/20 1013    Visit Number 8    Number of Visits 20    Date for PT Re-Evaluation 01/06/21    Authorization Type 3x/ week progressing to 2x/ week    PT Start Time 0932    PT Stop Time 1020    PT Time Calculation (min) 48 min    Activity Tolerance Patient tolerated treatment well    Behavior During Therapy [facility] for tasks assessed/performed           Past Medical History:  Diagnosis Date  . Arthritis     Past Surgical History:  Procedure Laterality Date  . NO PAST SURGERIES    . TOTAL KNEE ARTHROPLASTY Left 10/25/2020   Procedure: LEFT TOTAL KNEE ARTHROPLASTY;  Surgeon: Valeria Batman, MD;  Location: [facility];  Service: Orthopedics;  Laterality: Left;    There were no vitals filed for this visit.   Subjective Assessment - 11/25/20 0946    Subjective Pt reporting he has MD follow up and that MD had no concerns and said everything looks good. Patient relays about 4/10 knee pain, and stiffness today.    Pertinent History Left TKA on 10/25/2020.    Limitations Walking;Standing;House hold activities    Diagnostic tests X-ray, hardward intact    Patient Stated Goals Get back to work, bend my knee    Pain Onset More than a month ago                             [facility] Adult PT Treatment/Exercise - 11/25/20 0001      Knee/Hip Exercises: Stretches   Active Hamstring Stretch Left;2 reps;30 seconds    Active Hamstring Stretch Limitations seated    Knee: Self-Stretch Limitations seated tailgate stretch for flexion with self overpressure 10 sec X 3 min    Gastroc Stretch Both;2 reps;30 seconds    Gastroc Stretch Limitations  slantboard      Knee/Hip Exercises: Aerobic   Recumbent Bike rocking, then full revolutions 10 min total      Knee/Hip Exercises: Machines for Strengthening   Cybex Leg Press 100# bilateral LE's 3x 10, L LE only: 31# x 10      Knee/Hip Exercises: Standing   Heel Raises Limitations heel and toe raises X 20 reps    Other Standing Knee Exercises TRx squats 2 sets of 10      Vasopneumatic   Number Minutes Vasopneumatic  10 minutes    Vasopnuematic Location  Knee    Vasopneumatic Pressure Medium    Vasopneumatic Temperature  34      Manual Therapy   Manual therapy comments L knee PROM, flexion and extension with over pressure                    PT Short Term Goals - 11/21/20 0925      PT SHORT TERM GOAL #1   Title Pt will be independent in his HEP.    Status On-going      PT SHORT TERM GOAL #2   Title Pt will be able to amb with least restrictive device on level surfaces for 150 feet safely  with pain </= 4/10.    Status Achieved             PT Long Term Goals - 11/16/20 0819      PT LONG TERM GOAL #1   Title Pt will improve his FOTO from 47 to >/= 64.    Status On-going      PT LONG TERM GOAL #2   Title Pt will be able to perform left knee flexion to >= 120 degrees.    Status On-going      PT LONG TERM GOAL #3   Title Pt will be able to amb up and down 5 steps  with no hand rail with pain </= 2/10 in order to get in and out of pt's home safely.    Status On-going      PT LONG TERM GOAL #4   Title Pt will be able to improve his left LE strength to grossly 5/5 in order to improve gait and ADL's.    Status On-going      PT LONG TERM GOAL #5   Status On-going                 Plan - 11/25/20 1013    Clinical Impression Statement Continued with ROM focus and strenghtening to this tolerance. He does continue to have swelling so used vaso in efforts to reduce this. We will continue to progress him as tolerated.    Examination-Activity Limitations  Stairs;Squat;Stand;Lift    Examination-Participation Restrictions Other;Occupation;Community Activity;Driving    Stability/Clinical Decision Making Stable/Uncomplicated    Rehab Potential Good    PT Frequency Other (comment)    PT Duration 8 weeks    PT Treatment/Interventions ADLs/Self Care Home Management;Cryotherapy;Electrical Stimulation;Ultrasound;Gait training;Stair training;Functional mobility training;Therapeutic activities;Therapeutic exercise;Balance training;Neuromuscular re-education;Patient/family education;Manual techniques;Passive range of motion;Scar mobilization;Vasopneumatic Device;Taping    PT Next Visit Plan TKA protocol, LE strengtheing, ROM, gait, modalities as needed    PT Home Exercise Plan Access Code: [email or web address]  URL: [email or web address]  Date: 11/09/2020  Prepared by: Narda Amber    Exercises  Long Sitting Quad Set with Towel Roll Under Heel - 3 x daily - 7 x weekly - 2 sets - 10 reps  Supine Active Straight Leg Raise - 3 x daily - 7 x weekly - 2 sets - 10 reps  Supine Heel Slides - 3 x daily - 7 x weekly - 2 sets - 10 reps  Sit to Stand with Counter Support - 3 x daily - 7 x weekly - 2 sets - 10 reps    Consulted and Agree with Plan of Care Patient           Patient will benefit from skilled therapeutic intervention in order to improve the following deficits and impairments:  Pain,Abnormal gait,Difficulty walking,Decreased balance,Impaired flexibility,Increased edema,Decreased activity tolerance,Decreased strength,Decreased range of motion,Decreased mobility  Visit Diagnosis: Acute pain of left knee  Stiffness of left knee, not elsewhere classified  Localized edema     Problem List Patient Active Problem List   Diagnosis Date Noted  . Dizziness of unknown cause 10/25/2020  . S/P total knee arthroplasty, left 10/25/2020  . S/p bilateral revision of total hip replacement 10/25/2020  . Primary osteoarthritis of left knee 04/06/2018  .  Seasonal allergic rhinitis due to pollen 04/06/2018  . History of Bell's palsy 04/06/2018    Birdie Riddle 11/25/2020, 10:15 AM  [facility] Physical Therapy [address] [place], [place], [address] Phone: [phone]   Fax:  [phone]  Name:  [name] MRN: [id number] Date of Birth: 1970-12-23

## 2020-11-28 ENCOUNTER — Encounter: Payer: Self-pay | Admitting: Physical Therapy

## 2020-11-28 ENCOUNTER — Other Ambulatory Visit: Payer: Self-pay

## 2020-11-28 ENCOUNTER — Ambulatory Visit (INDEPENDENT_AMBULATORY_CARE_PROVIDER_SITE_OTHER): Payer: BC Managed Care – PPO | Admitting: Physical Therapy

## 2020-11-28 DIAGNOSIS — M25562 Pain in left knee: Secondary | ICD-10-CM

## 2020-11-28 DIAGNOSIS — R600 Localized edema: Secondary | ICD-10-CM | POA: Diagnosis not present

## 2020-11-28 DIAGNOSIS — M25662 Stiffness of left knee, not elsewhere classified: Secondary | ICD-10-CM

## 2020-11-28 NOTE — Therapy (Signed)
[facility] Physical Therapy [address] [place], [place], [address] Phone: [phone]   Fax:  [phone]  Physical Therapy Treatment  Patient Details  Name: [name] MRN: [id number] Date of Birth: 09-Aug-1971 Referring Provider (PT): [name], MD   Encounter Date: 11/28/2020   PT End of Session - 11/28/20 1113    Visit Number 9    Number of Visits 20    Date for PT Re-Evaluation 01/06/21    Authorization Type 3x/ week progressing to 2x/ week    PT Start Time 1100    PT Stop Time 1148    PT Time Calculation (min) 48 min    Equipment Utilized During Treatment Gait belt    Activity Tolerance Patient tolerated treatment well    Behavior During Therapy [facility] for tasks assessed/performed           Past Medical History:  Diagnosis Date  . Arthritis     Past Surgical History:  Procedure Laterality Date  . NO PAST SURGERIES    . TOTAL KNEE ARTHROPLASTY Left 10/25/2020   Procedure: LEFT TOTAL KNEE ARTHROPLASTY;  Surgeon: Valeria Batman, MD;  Location: [facility];  Service: Orthopedics;  Laterality: Left;    There were no vitals filed for this visit.   Subjective Assessment - 11/28/20 1109    Subjective Pt arriving to therapy today reporting 2/10 pain in Left knee. Pt reporting stiffness.    Pertinent History Left TKA on 10/25/2020.    Limitations Walking;Standing;House hold activities    Diagnostic tests X-ray, hardward intact    Patient Stated Goals Get back to work, bend my knee    Currently in Pain? Yes    Pain Location Knee    Pain Orientation Left    Pain Descriptors / Indicators Aching;Tightness    Pain Type Surgical pain    Pain Onset More than a month ago              [facility] PT Assessment - 11/28/20 0001      Assessment   Medical Diagnosis s/p L TKA    Referring Provider (PT) [name], MD    Onset Date/Surgical Date 10/25/20      AROM   Overall AROM Comments measured in supine    AROM Assessment Site Knee     Right/Left Knee Left    Left Knee Extension 4    Left Knee Flexion 95      PROM   Overall PROM Comments measured in supine    PROM Assessment Site Knee    Right/Left Knee Left    Left Knee Extension 2    Left Knee Flexion 98                         [facility] Adult PT Treatment/Exercise - 11/28/20 0001      Knee/Hip Exercises: Stretches   Knee: Self-Stretch Limitations seated tailgate stretch for flexion with self overpressure 10 sec X 3 min    Gastroc Stretch Both;2 reps;30 seconds    Gastroc Stretch Limitations slantboard      Knee/Hip Exercises: Aerobic   Recumbent Bike rocking, then full revolutions 10 min total gradually moving seat foward to      Knee/Hip Exercises: Machines for Strengthening   Cybex Leg Press L LE only: 37# x 15, 50# x 15      Knee/Hip Exercises: Standing   Heel Raises Limitations heel and toe raises X 20 reps    Step Down 20 reps;Step  Height: 8"    Other Standing Knee Exercises TRX squats 2 x 10      Vasopneumatic   Number Minutes Vasopneumatic  10 minutes    Vasopnuematic Location  Knee    Vasopneumatic Pressure Medium    Vasopneumatic Temperature  34      Manual Therapy   Manual therapy comments L knee PROM, flexion and extension with over pressure                    PT Short Term Goals - 11/28/20 1116      PT SHORT TERM GOAL #1   Title Pt will be independent in his HEP.    Status On-going      PT SHORT TERM GOAL #2   Title Pt will be able to amb with least restrictive device on level surfaces for 150 feet safely with pain </= 4/10.    Status Achieved             PT Long Term Goals - 11/28/20 1116      PT LONG TERM GOAL #1   Title Pt will improve his FOTO from 47 to >/= 64.    Status On-going      PT LONG TERM GOAL #2   Title Pt will be able to perform left knee flexion to >= 120 degrees.    Status On-going      PT LONG TERM GOAL #3   Title Pt will be able to amb up and down 5 steps  with no hand rail  with pain </= 2/10 in order to get in and out of pt's home safely.    Status On-going      PT LONG TERM GOAL #4   Title Pt will be able to improve his left LE strength to grossly 5/5 in order to improve gait and ADL's.    Status On-going      PT LONG TERM GOAL #5   Title Pt will be able to amb with no assistive device >/= 1000 feet on community level surfaces.    Status On-going                 Plan - 11/28/20 1114    Clinical Impression Statement Pt arriving today reporting 2/10 left knee pain. Pt focusing on AROM and overall strengthening. Pt reporting overall improvements in functional mobility.Pt with noted decresed swelling in left knee today. PROM measured 2-98 degrees at end of session.  Continue skilled PT progressing toward LTG's.    Examination-Activity Limitations Stairs;Squat;Stand;Lift    Examination-Participation Restrictions Other;Occupation;Community Activity;Driving    Stability/Clinical Decision Making Stable/Uncomplicated    Rehab Potential Good    PT Frequency Other (comment)    PT Duration 8 weeks    PT Treatment/Interventions ADLs/Self Care Home Management;Cryotherapy;Electrical Stimulation;Ultrasound;Gait training;Stair training;Functional mobility training;Therapeutic activities;Therapeutic exercise;Balance training;Neuromuscular re-education;Patient/family education;Manual techniques;Passive range of motion;Scar mobilization;Vasopneumatic Device;Taping    PT Next Visit Plan TKA protocol, LE strengtheing, ROM, gait, modalities as needed    PT Home Exercise Plan Access Code: [email or web address]  URL: [email or web address]  Date: 11/09/2020  Prepared by: Narda Amber    Exercises  Long Sitting Quad Set with Towel Roll Under Heel - 3 x daily - 7 x weekly - 2 sets - 10 reps  Supine Active Straight Leg Raise - 3 x daily - 7 x weekly - 2 sets - 10 reps  Supine Heel Slides - 3 x daily - 7 x weekly - 2 sets - 10 reps  Sit to Stand with Counter Support - 3 x daily -  7 x weekly - 2 sets - 10 reps    Consulted and Agree with Plan of Care Patient           Patient will benefit from skilled therapeutic intervention in order to improve the following deficits and impairments:  Pain,Abnormal gait,Difficulty walking,Decreased balance,Impaired flexibility,Increased edema,Decreased activity tolerance,Decreased strength,Decreased range of motion,Decreased mobility  Visit Diagnosis: Acute pain of left knee  Stiffness of left knee, not elsewhere classified  Localized edema     Problem List Patient Active Problem List   Diagnosis Date Noted  . Dizziness of unknown cause 10/25/2020  . S/P total knee arthroplasty, left 10/25/2020  . S/p bilateral revision of total hip replacement 10/25/2020  . Primary osteoarthritis of left knee 04/06/2018  . Seasonal allergic rhinitis due to pollen 04/06/2018  . History of Bell's palsy 04/06/2018    Sharmon Leyden, PT, MPT 11/28/2020, 11:39 AM  [facility] Physical Therapy [address] [place], [place], [address] Phone: [phone]   Fax:  [phone]  Name: [name] MRN: [id number] Date of Birth: 1971-02-15

## 2020-11-30 ENCOUNTER — Ambulatory Visit (INDEPENDENT_AMBULATORY_CARE_PROVIDER_SITE_OTHER): Payer: BC Managed Care – PPO | Admitting: Physical Therapy

## 2020-11-30 ENCOUNTER — Encounter: Payer: Self-pay | Admitting: Physical Therapy

## 2020-11-30 ENCOUNTER — Other Ambulatory Visit: Payer: Self-pay

## 2020-11-30 DIAGNOSIS — M25562 Pain in left knee: Secondary | ICD-10-CM | POA: Diagnosis not present

## 2020-11-30 DIAGNOSIS — R600 Localized edema: Secondary | ICD-10-CM

## 2020-11-30 DIAGNOSIS — M25662 Stiffness of left knee, not elsewhere classified: Secondary | ICD-10-CM | POA: Diagnosis not present

## 2020-11-30 NOTE — Therapy (Signed)
[facility] Physical Therapy [address] [place], [place], [address] Phone: [phone]   Fax:  [phone]  Physical Therapy Treatment Progress Note  Patient Details  Name: [name] MRN: [id number] Date of Birth: 10-10-1970 Referring Provider (PT): [name], MD   Encounter Date: 11/30/2020   PT End of Session - 11/30/20 1118    Visit Number 10    Number of Visits 20    Date for PT Re-Evaluation 01/06/21    Authorization Type 3x/ week progressing to 2x/ week    Progress Note Due on Visit 20    PT Start Time 0930    PT Stop Time 1018    PT Time Calculation (min) 48 min    Equipment Utilized During Treatment Gait belt    Activity Tolerance Patient tolerated treatment well    Behavior During Therapy [facility] for tasks assessed/performed           Past Medical History:  Diagnosis Date  . Arthritis     Past Surgical History:  Procedure Laterality Date  . NO PAST SURGERIES    . TOTAL KNEE ARTHROPLASTY Left 10/25/2020   Procedure: LEFT TOTAL KNEE ARTHROPLASTY;  Surgeon: Valeria Batman, MD;  Location: [facility];  Service: Orthopedics;  Laterality: Left;    There were no vitals filed for this visit.   Subjective Assessment - 11/30/20 1117    Subjective Pt arriving to therapy reporting no pain, only stiffness.    Pertinent History Left TKA on 10/25/2020.    Limitations Walking;Standing;House hold activities    Diagnostic tests X-ray, hardward intact    Patient Stated Goals Get back to work, bend my knee    Currently in Pain? No/denies              [facility] PT Assessment - 11/30/20 0001      Assessment   Medical Diagnosis s/p L TKA    Referring Provider (PT) [name], MD    Onset Date/Surgical Date 10/25/20      AROM   Overall AROM Comments measured in sitting    Right/Left Knee Left    Left Knee Flexion 96      PROM   Overall PROM Comments measured sitting    PROM Assessment Site Knee    Right/Left Knee Left    Left Knee  Flexion 100                         [facility] Adult PT Treatment/Exercise - 11/30/20 0001      Ambulation/Gait   Gait Comments gait training using no assistive device on level surfaces. Pt instructed to no longer use his straight cane.      Knee/Hip Exercises: Stretches   Knee: Self-Stretch Limitations seated tailgate stretch for flexion with self overpressure 10 sec X 3 min    Gastroc Stretch Both;2 reps;30 seconds      Knee/Hip Exercises: Aerobic   Recumbent Bike full revolution seat at 6 x 5 minutes   perfomed at ned of session today     Knee/Hip Exercises: Machines for Strengthening   Cybex Leg Press L LE 50# 2x10, bilateral LE's 87# 20 reps for warm up      Knee/Hip Exercises: Standing   Other Standing Knee Exercises up and down 1 flight of stairs with step though pattern using single hand rail    Other Standing Knee Exercises TRX squats 2 x 10, BOSU ball x 2 minutes intermittent UE support  Vasopneumatic   Number Minutes Vasopneumatic  10 minutes    Vasopnuematic Location  Knee    Vasopneumatic Pressure Medium    Vasopneumatic Temperature  34      Manual Therapy   Manual therapy comments L knee PROM, flexion and extension with over pressure                    PT Short Term Goals - 11/30/20 1123      PT SHORT TERM GOAL #1   Title Pt will be independent in his HEP.    Status Achieved      PT SHORT TERM GOAL #2   Title Pt will be able to amb with least restrictive device on level surfaces for 150 feet safely with pain </= 4/10.    Status Achieved             PT Long Term Goals - 11/30/20 1123      PT LONG TERM GOAL #1   Title Pt will improve his FOTO from 47 to >/= 64.    Status On-going      PT LONG TERM GOAL #2   Title Pt will be able to perform left knee flexion to >= 120 degrees.    Baseline AROM: 96 degrees    Period Weeks    Status On-going      PT LONG TERM GOAL #3   Title Pt will be able to amb up and down 5 steps  with  no hand rail with pain </= 2/10 in order to get in and out of pt's home safely.    Baseline 1 flight performed with pain increased to 3-4/10 in left knee    Period Weeks    Status On-going      PT LONG TERM GOAL #4   Title Pt will be able to improve his left LE strength to grossly 5/5 in order to improve gait and ADL's.    Status On-going      PT LONG TERM GOAL #5   Title Pt will be able to amb with no assistive device >/= 1000 feet on community level surfaces.    Baseline progressed off straight cane on 11/30/2020, progressing toward community surfaces    Status On-going              [facility] PT Assessment - 11/30/20 0001      Assessment   Medical Diagnosis s/p L TKA    Referring Provider (PT) [name], MD    Onset Date/Surgical Date 10/25/20      AROM   Overall AROM Comments measured in sitting    Right/Left Knee Left    Left Knee Flexion 96      PROM   Overall PROM Comments measured sitting    PROM Assessment Site Knee    Right/Left Knee Left    Left Knee Flexion 100                Plan - 11/30/20 1119    Clinical Impression Statement Pt arriving to therapy reporting no pain. Pt did reporting incresaed pain with flexion activities, squats and stair climbing. Pt has progressed to walking without a straight cane. Pt's PROM is to 100 degrees, AROM 96 degrees. Pt is making progress with overall strength and ROM. Next visit pt will need to perform FOTO.    Examination-Activity Limitations Stairs;Squat;Stand;Lift    Examination-Participation Restrictions Other;Occupation;Community Activity;Driving    Stability/Clinical Decision Making Stable/Uncomplicated    Rehab Potential Good  PT Frequency Other (comment)    PT Duration 8 weeks    PT Treatment/Interventions ADLs/Self Care Home Management;Cryotherapy;Electrical Stimulation;Ultrasound;Gait training;Stair training;Functional mobility training;Therapeutic activities;Therapeutic exercise;Balance  training;Neuromuscular re-education;Patient/family education;Manual techniques;Passive range of motion;Scar mobilization;Vasopneumatic Device;Taping    PT Next Visit Plan Perform FOTO. TKA protocol, LE strengtheing, ROM, gait, modalities as needed    PT Home Exercise Plan Access Code: [email or web address]  URL: [email or web address]  Date: 11/09/2020  Prepared by: Narda Amber    Exercises  Long Sitting Quad Set with Towel Roll Under Heel - 3 x daily - 7 x weekly - 2 sets - 10 reps  Supine Active Straight Leg Raise - 3 x daily - 7 x weekly - 2 sets - 10 reps  Supine Heel Slides - 3 x daily - 7 x weekly - 2 sets - 10 reps  Sit to Stand with Counter Support - 3 x daily - 7 x weekly - 2 sets - 10 reps    Consulted and Agree with Plan of Care Patient           Patient will benefit from skilled therapeutic intervention in order to improve the following deficits and impairments:  Pain,Abnormal gait,Difficulty walking,Decreased balance,Impaired flexibility,Increased edema,Decreased activity tolerance,Decreased strength,Decreased range of motion,Decreased mobility  Visit Diagnosis: Acute pain of left knee  Stiffness of left knee, not elsewhere classified  Localized edema     Problem List Patient Active Problem List   Diagnosis Date Noted  . Dizziness of unknown cause 10/25/2020  . S/P total knee arthroplasty, left 10/25/2020  . S/p bilateral revision of total hip replacement 10/25/2020  . Primary osteoarthritis of left knee 04/06/2018  . Seasonal allergic rhinitis due to pollen 04/06/2018  . History of Bell's palsy 04/06/2018    Lauralyn Primes, MPT 11/30/2020, 11:29 AM  [facility] Physical Therapy [address] [place], [place], [address] Phone: [phone]   Fax:  [phone]  Name: [name] MRN: [id number] Date of Birth: 18-Dec-1970

## 2020-12-02 ENCOUNTER — Other Ambulatory Visit: Payer: Self-pay

## 2020-12-02 ENCOUNTER — Encounter: Payer: Self-pay | Admitting: Physical Therapy

## 2020-12-02 ENCOUNTER — Ambulatory Visit (INDEPENDENT_AMBULATORY_CARE_PROVIDER_SITE_OTHER): Payer: BC Managed Care – PPO | Admitting: Physical Therapy

## 2020-12-02 DIAGNOSIS — M25662 Stiffness of left knee, not elsewhere classified: Secondary | ICD-10-CM

## 2020-12-02 DIAGNOSIS — R600 Localized edema: Secondary | ICD-10-CM | POA: Diagnosis not present

## 2020-12-02 DIAGNOSIS — M25562 Pain in left knee: Secondary | ICD-10-CM

## 2020-12-02 NOTE — Therapy (Signed)
[facility] Physical Therapy [address] [place], [place], [address] Phone: [phone]   Fax:  [phone]  Physical Therapy Treatment  Patient Details  Name: [name] MRN: [id number] Date of Birth: 02-07-1971 Referring Provider (PT): [name], MD   Encounter Date: 12/02/2020   PT End of Session - 12/02/20 1010    Visit Number 11    Number of Visits 20    Date for PT Re-Evaluation 01/06/21    Authorization Type 3x/ week progressing to 2x/ week    Progress Note Due on Visit 20    PT Start Time 0935    PT Stop Time 1023    PT Time Calculation (min) 48 min    Equipment Utilized During Treatment Gait belt    Activity Tolerance Patient tolerated treatment well    Behavior During Therapy [facility] for tasks assessed/performed           Past Medical History:  Diagnosis Date  . Arthritis     Past Surgical History:  Procedure Laterality Date  . NO PAST SURGERIES    . TOTAL KNEE ARTHROPLASTY Left 10/25/2020   Procedure: LEFT TOTAL KNEE ARTHROPLASTY;  Surgeon: Valeria Batman, MD;  Location: [facility];  Service: Orthopedics;  Laterality: Left;    There were no vitals filed for this visit.   Subjective Assessment - 12/02/20 1000    Subjective Pt arriving without AD for gait and he relays this is going well, he reports about 2-3 out of 10 pain in his left knee    Pertinent History Left TKA on 10/25/2020.    Limitations Walking;Standing;House hold activities    Diagnostic tests X-ray, hardward intact    Patient Stated Goals Get back to work, bend my knee    Pain Onset More than a month ago                             [facility] Adult PT Treatment/Exercise - 12/02/20 0001      Knee/Hip Exercises: Stretches   Lobbyist Left;3 reps;30 seconds    Quad Stretch Limitations supine leg off EOB with strap    Knee: Self-Stretch Limitations seated tailgate stretch for flexion with self overpressure 10 sec X 3 min    Gastroc Stretch Both;2  reps;30 seconds      Knee/Hip Exercises: Aerobic   Nustep L5 X 10 min UE/LE holding 5 sec at end range flexion      Knee/Hip Exercises: Machines for Strengthening   Cybex Leg Press bilat 100 lbs 3X10, then LLE 50 lbs 2X10      Knee/Hip Exercises: Standing   Heel Raises Limitations heel and toe raises X 20 reps    Forward Step Up Limitations 6 inch step up and down  X10 ea for LLE with one UE support      Vasopneumatic   Number Minutes Vasopneumatic  10 minutes    Vasopnuematic Location  Knee    Vasopneumatic Pressure Medium    Vasopneumatic Temperature  34      Manual Therapy   Manual therapy comments L knee PROM, flexion mobs                    PT Short Term Goals - 11/30/20 1123      PT SHORT TERM GOAL #1   Title Pt will be independent in his HEP.    Status Achieved      PT SHORT TERM GOAL #2  Title Pt will be able to amb with least restrictive device on level surfaces for 150 feet safely with pain </= 4/10.    Status Achieved             PT Long Term Goals - 11/30/20 1123      PT LONG TERM GOAL #1   Title Pt will improve his FOTO from 47 to >/= 64.    Status On-going      PT LONG TERM GOAL #2   Title Pt will be able to perform left knee flexion to >= 120 degrees.    Baseline AROM: 96 degrees    Period Weeks    Status On-going      PT LONG TERM GOAL #3   Title Pt will be able to amb up and down 5 steps  with no hand rail with pain </= 2/10 in order to get in and out of pt's home safely.    Baseline 1 flight performed with pain increased to 3-4/10 in left knee    Period Weeks    Status On-going      PT LONG TERM GOAL #4   Title Pt will be able to improve his left LE strength to grossly 5/5 in order to improve gait and ADL's.    Status On-going      PT LONG TERM GOAL #5   Title Pt will be able to amb with no assistive device >/= 1000 feet on community level surfaces.    Baseline progressed off straight cane on 11/30/2020, progressing toward  community surfaces    Status On-going                 Plan - 12/02/20 1011    Clinical Impression Statement He is now ambulating without AD and shows good stability and confidence with this. He is overalll progressing well with strength but still has some ROM limitaions that we continue to work on as tolerated.    Examination-Activity Limitations Stairs;Squat;Stand;Lift    Examination-Participation Restrictions Other;Occupation;Community Activity;Driving    Stability/Clinical Decision Making Stable/Uncomplicated    Rehab Potential Good    PT Frequency Other (comment)    PT Duration 8 weeks    PT Treatment/Interventions ADLs/Self Care Home Management;Cryotherapy;Electrical Stimulation;Ultrasound;Gait training;Stair training;Functional mobility training;Therapeutic activities;Therapeutic exercise;Balance training;Neuromuscular re-education;Patient/family education;Manual techniques;Passive range of motion;Scar mobilization;Vasopneumatic Device;Taping    PT Next Visit Plan Perform FOTO. TKA protocol, LE strengtheing, ROM, gait, modalities as needed    PT Home Exercise Plan Access Code: [email or web address]  URL: [email or web address]  Date: 11/09/2020  Prepared by: Narda Amber    Exercises  Long Sitting Quad Set with Towel Roll Under Heel - 3 x daily - 7 x weekly - 2 sets - 10 reps  Supine Active Straight Leg Raise - 3 x daily - 7 x weekly - 2 sets - 10 reps  Supine Heel Slides - 3 x daily - 7 x weekly - 2 sets - 10 reps  Sit to Stand with Counter Support - 3 x daily - 7 x weekly - 2 sets - 10 reps    Consulted and Agree with Plan of Care Patient           Patient will benefit from skilled therapeutic intervention in order to improve the following deficits and impairments:  Pain,Abnormal gait,Difficulty walking,Decreased balance,Impaired flexibility,Increased edema,Decreased activity tolerance,Decreased strength,Decreased range of motion,Decreased mobility  Visit Diagnosis: Acute  pain of left knee  Stiffness of left knee, not elsewhere classified  Localized edema  Problem List Patient Active Problem List   Diagnosis Date Noted  . Dizziness of unknown cause 10/25/2020  . S/P total knee arthroplasty, left 10/25/2020  . S/p bilateral revision of total hip replacement 10/25/2020  . Primary osteoarthritis of left knee 04/06/2018  . Seasonal allergic rhinitis due to pollen 04/06/2018  . History of Bell's palsy 04/06/2018    Birdie Riddle 12/02/2020, 10:13 AM  [facility] Physical Therapy [address] [place], [place], [address] Phone: [phone]   Fax:  [phone]  Name: Ashkan Chamberland MRN: [id number] Date of Birth: 04/01/1971

## 2020-12-05 ENCOUNTER — Other Ambulatory Visit: Payer: Self-pay

## 2020-12-05 ENCOUNTER — Ambulatory Visit (INDEPENDENT_AMBULATORY_CARE_PROVIDER_SITE_OTHER): Payer: BC Managed Care – PPO | Admitting: Physical Therapy

## 2020-12-05 ENCOUNTER — Encounter: Payer: Self-pay | Admitting: Physical Therapy

## 2020-12-05 DIAGNOSIS — M25562 Pain in left knee: Secondary | ICD-10-CM

## 2020-12-05 DIAGNOSIS — R600 Localized edema: Secondary | ICD-10-CM

## 2020-12-05 DIAGNOSIS — M25662 Stiffness of left knee, not elsewhere classified: Secondary | ICD-10-CM

## 2020-12-05 NOTE — Therapy (Signed)
[facility] Physical Therapy [address] [place], [place], [address] Phone: [phone]   Fax:  [phone]  Physical Therapy Treatment  Patient Details  Name: [name] MRN: [id number] Date of Birth: [birth year] Referring Provider (PT): [name], MD   Encounter Date: 12/05/2020   PT End of Session - 12/05/20 1115    Visit Number 12    Number of Visits 20    Date for PT Re-Evaluation 01/06/21    Authorization Type 3x/ week progressing to 2x/ week    Progress Note Due on Visit 20    PT Start Time 1102    PT Stop Time 1145    PT Time Calculation (min) 43 min    Equipment Utilized During Treatment Gait belt    Activity Tolerance Patient tolerated treatment well    Behavior During Therapy [facility] for tasks assessed/performed           Past Medical History:  Diagnosis Date  . Arthritis     Past Surgical History:  Procedure Laterality Date  . NO PAST SURGERIES    . TOTAL KNEE ARTHROPLASTY Left 10/25/2020   Procedure: LEFT TOTAL KNEE ARTHROPLASTY;  Surgeon: Valeria Batman, MD;  Location: [facility];  Service: Orthopedics;  Laterality: Left;    There were no vitals filed for this visit.   Subjective Assessment - 12/05/20 1113    Subjective Pt reporting 4/10 pain in left knee.    Pertinent History Left TKA on 10/25/2020.    Limitations Walking;Standing;House hold activities    Diagnostic tests X-ray, hardward intact    Patient Stated Goals Get back to work, bend my knee    Currently in Pain? Yes    Pain Score 4     Pain Onset More than a month ago              [facility] PT Assessment - 12/05/20 0001      Assessment   Medical Diagnosis s/p L TKA    Referring Provider (PT) [name], MD    Onset Date/Surgical Date 10/25/20      AROM   Overall AROM Comments measured in supine    AROM Assessment Site Knee    Right/Left Knee Left    Left Knee Flexion 95      PROM   Overall PROM Comments measure in supine    PROM Assessment Site  Knee    Right/Left Knee Left    Left Knee Flexion 98                         [facility] Adult PT Treatment/Exercise - 12/05/20 0001      Knee/Hip Exercises: Stretches   Lobbyist Left;3 reps;30 seconds    Quad Stretch Limitations supine leg off EOB with strap    Knee: Self-Stretch Limitations seated tailgate stretch for flexion with self overpressure 10 sec X 3 min    Gastroc Stretch Both;2 reps;30 seconds      Knee/Hip Exercises: Aerobic   Recumbent Bike full revolution after about 1 minute warm up with seat at 7 for 6 minutes      Knee/Hip Exercises: Machines for Strengthening   Cybex Leg Press bilat 100 lbs 3X10, then LLE 50 lbs 2X10      Knee/Hip Exercises: Standing   Forward Step Up Limitations 6 inch step up and down  X10 ea for LLE with one UE support    Other Standing Knee Exercises TRX squats x 15  Vasopneumatic   Number Minutes Vasopneumatic  10 minutes    Vasopnuematic Location  Knee    Vasopneumatic Pressure Medium    Vasopneumatic Temperature  34      Manual Therapy   Manual therapy comments L knee PROM, flexion mobs, contract relax L LE                    PT Short Term Goals - 12/05/20 1117      PT SHORT TERM GOAL #1   Title Pt will be independent in his HEP.    Status Achieved      PT SHORT TERM GOAL #2   Title Pt will be able to amb with least restrictive device on level surfaces for 150 feet safely with pain </= 4/10.    Status Achieved             PT Long Term Goals - 12/05/20 1118      PT LONG TERM GOAL #1   Title Pt will improve his FOTO from 47 to >/= 64.    Baseline 47 on 11/09/2020., 59 on 12/05/2020    Status On-going      PT LONG TERM GOAL #2   Title Pt will be able to perform left knee flexion to >= 120 degrees.    Baseline AROM: 96 degrees    Status On-going      PT LONG TERM GOAL #3   Title Pt will be able to amb up and down 5 steps  with no hand rail with pain </= 2/10 in order to get in and out of  pt's home safely.    Baseline 1 flight performed with pain increased to 4/10 in left knee    Status On-going      PT LONG TERM GOAL #4   Title Pt will be able to improve his left LE strength to grossly 5/5 in order to improve gait and ADL's.    Status On-going      PT LONG TERM GOAL #5   Title Pt will be able to amb with no assistive device >/= 1000 feet on community level surfaces.    Status On-going                 Plan - 12/05/20 1116    Clinical Impression Statement Pt is progressing with overall LE strength and balance. Pt still progressing with AROM in Left knee felxion. Contiue to progress pt as he tolerates toward his PLOF.    Examination-Activity Limitations Stairs;Squat;Stand;Lift    Examination-Participation Restrictions Other;Occupation;Community Activity;Driving    Stability/Clinical Decision Making Stable/Uncomplicated    Rehab Potential Good    PT Frequency Other (comment)    PT Duration 8 weeks    PT Treatment/Interventions ADLs/Self Care Home Management;Cryotherapy;Electrical Stimulation;Ultrasound;Gait training;Stair training;Functional mobility training;Therapeutic activities;Therapeutic exercise;Balance training;Neuromuscular re-education;Patient/family education;Manual techniques;Passive range of motion;Scar mobilization;Vasopneumatic Device;Taping    PT Next Visit Plan TKA protocol, LE strengtheing, ROM, gait, modalities as needed    PT Home Exercise Plan Access Code: [email or web address]  URL: [email or web address]  Date: 11/09/2020  Prepared by: Narda Amber    Exercises  Long Sitting Quad Set with Towel Roll Under Heel - 3 x daily - 7 x weekly - 2 sets - 10 reps  Supine Active Straight Leg Raise - 3 x daily - 7 x weekly - 2 sets - 10 reps  Supine Heel Slides - 3 x daily - 7 x weekly - 2 sets - 10 reps  Sit to Stand with  Counter Support - 3 x daily - 7 x weekly - 2 sets - 10 reps    Consulted and Agree with Plan of Care Patient           Patient will  benefit from skilled therapeutic intervention in order to improve the following deficits and impairments:  Pain,Abnormal gait,Difficulty walking,Decreased balance,Impaired flexibility,Increased edema,Decreased activity tolerance,Decreased strength,Decreased range of motion,Decreased mobility  Visit Diagnosis: Acute pain of left knee  Stiffness of left knee, not elsewhere classified  Localized edema     Problem List Patient Active Problem List   Diagnosis Date Noted  . Dizziness of unknown cause 10/25/2020  . S/P total knee arthroplasty, left 10/25/2020  . S/p bilateral revision of total hip replacement 10/25/2020  . Primary osteoarthritis of left knee 04/06/2018  . Seasonal allergic rhinitis due to pollen 04/06/2018  . History of Bell's palsy 04/06/2018    Sharmon Leyden, PT, MPT 12/05/2020, 11:40 AM  [facility] Physical Therapy [address] [place], [place], [address] Phone: [phone]   Fax:  [phone]  Name: Tal Kempker MRN: [id number] Date of Birth: [birth year]

## 2020-12-06 ENCOUNTER — Ambulatory Visit (INDEPENDENT_AMBULATORY_CARE_PROVIDER_SITE_OTHER): Payer: BC Managed Care – PPO | Admitting: Physical Therapy

## 2020-12-06 ENCOUNTER — Encounter: Payer: Self-pay | Admitting: Physical Therapy

## 2020-12-06 DIAGNOSIS — R600 Localized edema: Secondary | ICD-10-CM | POA: Diagnosis not present

## 2020-12-06 DIAGNOSIS — M25562 Pain in left knee: Secondary | ICD-10-CM | POA: Diagnosis not present

## 2020-12-06 DIAGNOSIS — M25662 Stiffness of left knee, not elsewhere classified: Secondary | ICD-10-CM

## 2020-12-06 NOTE — Therapy (Signed)
[facility] Physical Therapy [address] [place], [place], [address] Phone: [phone]   Fax:  [phone]  Physical Therapy Treatment  Patient Details  Name: [name] MRN: [id number] Date of Birth: 1971/03/09 Referring Provider (PT): [name], MD   Encounter Date: 12/06/2020   PT End of Session - 12/06/20 0937    Visit Number 13    Number of Visits 20    Date for PT Re-Evaluation 01/06/21    Authorization Type 3x/ week progressing to 2x/ week    Progress Note Due on Visit 20    PT Start Time 0930    PT Stop Time 1015    PT Time Calculation (min) 45 min    Equipment Utilized During Treatment Gait belt    Activity Tolerance Patient tolerated treatment well    Behavior During Therapy [facility] for tasks assessed/performed           Past Medical History:  Diagnosis Date  . Arthritis     Past Surgical History:  Procedure Laterality Date  . NO PAST SURGERIES    . TOTAL KNEE ARTHROPLASTY Left 10/25/2020   Procedure: LEFT TOTAL KNEE ARTHROPLASTY;  Surgeon: Valeria Batman, MD;  Location: [facility];  Service: Orthopedics;  Laterality: Left;    There were no vitals filed for this visit.   Subjective Assessment - 12/06/20 0933    Subjective Pt arriving today reporting pain is about the same as yesterday of 4/10 in his left knee.    Pertinent History Left TKA on 10/25/2020.    Limitations Walking;Standing;House hold activities    Diagnostic tests X-ray, hardward intact    Patient Stated Goals Get back to work, bend my knee    Currently in Pain? Yes    Pain Score 4     Pain Location Knee    Pain Orientation Left    Pain Descriptors / Indicators Aching;Tightness    Pain Type Surgical pain    Pain Onset More than a month ago              [facility] PT Assessment - 12/06/20 0001      Assessment   Medical Diagnosis s/p L TKA    Referring Provider (PT) [name], MD    Onset Date/Surgical Date 10/25/20      AROM   Overall AROM  Comments measured in supine    AROM Assessment Site Knee    Right/Left Knee Left    Left Knee Flexion 100      PROM   Overall PROM Comments measure in supine    PROM Assessment Site Knee    Right/Left Knee Left    Left Knee Flexion 104                         [facility] Adult PT Treatment/Exercise - 12/06/20 0001      Knee/Hip Exercises: Stretches   Quad Stretch Left;3 reps;30 seconds    Quad Stretch Limitations supine leg off EOB with strap    Gastroc Stretch Both;2 reps;30 seconds      Knee/Hip Exercises: Aerobic   Recumbent Bike partial revolutions progressing to full after about 1 minute, seat at 7 for total of 8 minutes      Knee/Hip Exercises: Machines for Strengthening   Cybex Knee Flexion 25# L LE only 3x 10    Cybex Leg Press bilat 100 lbs 3X10, then LLE 50 lbs 2X10      Knee/Hip Exercises: Standing   Other  Standing Knee Exercises TRX squats x 15      Vasopneumatic   Number Minutes Vasopneumatic  10 minutes    Vasopnuematic Location  Knee    Vasopneumatic Pressure Medium    Vasopneumatic Temperature  34      Manual Therapy   Manual therapy comments L knee PROM, flexion mobs, contract relax L LE                  PT Education - 12/06/20 0935    Education Details Pt concerned about swelling and pain. Pt asking shoould he start taking over the counter pain medication again. I advised pt to take as needed and to continue to ice and elevate at home for swelling.    Person(s) Educated Patient    Methods Explanation    Comprehension Verbalized understanding            PT Short Term Goals - 12/05/20 1117      PT SHORT TERM GOAL #1   Title Pt will be independent in his HEP.    Status Achieved      PT SHORT TERM GOAL #2   Title Pt will be able to amb with least restrictive device on level surfaces for 150 feet safely with pain </= 4/10.    Status Achieved             PT Long Term Goals - 12/06/20 0941      PT LONG TERM GOAL #1    Title Pt will improve his FOTO from 47 to >/= 64.    Status On-going      PT LONG TERM GOAL #2   Title Pt will be able to perform left knee flexion to >= 120 degrees.    Status On-going      PT LONG TERM GOAL #3   Title Pt will be able to amb up and down 5 steps  with no hand rail with pain </= 2/10 in order to get in and out of pt's home safely.    Status On-going      PT LONG TERM GOAL #4   Title Pt will be able to improve his left LE strength to grossly 5/5 in order to improve gait and ADL's.    Status On-going      PT LONG TERM GOAL #5   Title Pt will be able to amb with no assistive device >/= 1000 feet on community level surfaces.    Baseline Pt amb with no assistive device, but reporting fatigue and pain when walking community surfaces.    Status On-going                 Plan - 12/06/20 0454    Clinical Impression Statement Pt is making progress with overall strengthening and balance. Pt still reporting 4/10 pain over the last 2 days. Pt progressing with Left knee flexion with AROM measured today 100 degrees and passive 104 degrees.  Discussed beginning over the counter pain meds to help with pain at home. Pt instructed to contiue to ice and elevate as needed for swelling. Continue skilled PT.    Examination-Activity Limitations Stairs;Squat;Stand;Lift    Examination-Participation Restrictions Other;Occupation;Community Activity;Driving    Stability/Clinical Decision Making Stable/Uncomplicated    Rehab Potential Good    PT Frequency Other (comment)    PT Duration 8 weeks    PT Treatment/Interventions ADLs/Self Care Home Management;Cryotherapy;Electrical Stimulation;Ultrasound;Gait training;Stair training;Functional mobility training;Therapeutic activities;Therapeutic exercise;Balance training;Neuromuscular re-education;Patient/family education;Manual techniques;Passive range of motion;Scar mobilization;Vasopneumatic Device;Taping  PT Next Visit Plan TKA protocol, LE  strengtheing, ROM, gait, modalities as needed    PT Home Exercise Plan Access Code: [email or web address]  URL: [email or web address]  Date: 11/09/2020  Prepared by: Narda Amber    Exercises  Long Sitting Quad Set with Towel Roll Under Heel - 3 x daily - 7 x weekly - 2 sets - 10 reps  Supine Active Straight Leg Raise - 3 x daily - 7 x weekly - 2 sets - 10 reps  Supine Heel Slides - 3 x daily - 7 x weekly - 2 sets - 10 reps  Sit to Stand with Counter Support - 3 x daily - 7 x weekly - 2 sets - 10 reps    Consulted and Agree with Plan of Care Patient           Patient will benefit from skilled therapeutic intervention in order to improve the following deficits and impairments:  Pain,Abnormal gait,Difficulty walking,Decreased balance,Impaired flexibility,Increased edema,Decreased activity tolerance,Decreased strength,Decreased range of motion,Decreased mobility  Visit Diagnosis: Acute pain of left knee  Stiffness of left knee, not elsewhere classified  Localized edema     Problem List Patient Active Problem List   Diagnosis Date Noted  . Dizziness of unknown cause 10/25/2020  . S/P total knee arthroplasty, left 10/25/2020  . S/p bilateral revision of total hip replacement 10/25/2020  . Primary osteoarthritis of left knee 04/06/2018  . Seasonal allergic rhinitis due to pollen 04/06/2018  . History of Bell's palsy 04/06/2018    Sharmon Leyden, PT, MPT 12/06/2020, 10:03 AM  [facility] Physical Therapy [address] [place], [place], [address] Phone: [phone]   Fax:  [phone]  Name: [name] MRN: [id number] Date of Birth: 11-06-1970

## 2020-12-07 ENCOUNTER — Ambulatory Visit (INDEPENDENT_AMBULATORY_CARE_PROVIDER_SITE_OTHER): Payer: BC Managed Care – PPO | Admitting: Physical Therapy

## 2020-12-07 ENCOUNTER — Encounter: Payer: Self-pay | Admitting: Physical Therapy

## 2020-12-07 ENCOUNTER — Other Ambulatory Visit: Payer: Self-pay

## 2020-12-07 DIAGNOSIS — M25662 Stiffness of left knee, not elsewhere classified: Secondary | ICD-10-CM | POA: Diagnosis not present

## 2020-12-07 DIAGNOSIS — R600 Localized edema: Secondary | ICD-10-CM | POA: Diagnosis not present

## 2020-12-07 DIAGNOSIS — M25562 Pain in left knee: Secondary | ICD-10-CM | POA: Diagnosis not present

## 2020-12-07 NOTE — Therapy (Signed)
[facility] Physical Therapy [address] [place], [place], [address] Phone: [phone]   Fax:  [phone]  Physical Therapy Treatment  Patient Details  Name: [name] MRN: [id number] Date of Birth: May 01, 1971 Referring Provider (PT): [name], MD   Encounter Date: 12/07/2020   PT End of Session - 12/07/20 1015    Visit Number 14    Number of Visits 20    Date for PT Re-Evaluation 01/06/21    Authorization Type 3x/ week progressing to 2x/ week    Progress Note Due on Visit 20    PT Start Time 0931    PT Stop Time 1023    PT Time Calculation (min) 52 min    Equipment Utilized During Treatment Gait belt    Activity Tolerance Patient tolerated treatment well    Behavior During Therapy [facility] for tasks assessed/performed           Past Medical History:  Diagnosis Date  . Arthritis     Past Surgical History:  Procedure Laterality Date  . NO PAST SURGERIES    . TOTAL KNEE ARTHROPLASTY Left 10/25/2020   Procedure: LEFT TOTAL KNEE ARTHROPLASTY;  Surgeon: Valeria Batman, MD;  Location: [facility];  Service: Orthopedics;  Laterality: Left;    There were no vitals filed for this visit.   Subjective Assessment - 12/07/20 0932    Subjective pain improved today; overall doing well    Pertinent History Left TKA on 10/25/2020.    Limitations Walking;Standing;House hold activities    Diagnostic tests X-ray, hardward intact    Patient Stated Goals Get back to work, bend my knee    Currently in Pain? Yes    Pain Score 2     Pain Location Knee    Pain Orientation Left    Pain Descriptors / Indicators Aching;Tightness    Pain Type Surgical pain    Pain Onset More than a month ago    Pain Frequency Intermittent    Aggravating Factors  bending knee    Pain Relieving Factors ice, meds              [facility] PT Assessment - 12/07/20 0936      Assessment   Medical Diagnosis s/p L TKA    Referring Provider (PT) [name], MD    Onset  Date/Surgical Date 10/25/20    Next MD Visit 12/28/20                         [facility] Adult PT Treatment/Exercise - 12/07/20 0935      Knee/Hip Exercises: Stretches   Quad Stretch Left;3 reps;30 seconds    Quad Stretch Limitations supine leg off EOB with strap      Knee/Hip Exercises: Aerobic   Nustep L7 x 10 min      Knee/Hip Exercises: Machines for Strengthening   Cybex Knee Extension LLE only 5# 3x10    Cybex Knee Flexion 25# L LE only 3x 10    Cybex Leg Press bilat 100 lbs 3X10, then LLE 50 lbs 2X10      Knee/Hip Exercises: Standing   SLS on compliant surface 5x15 sec LLE with intermittent UE support    Other Standing Knee Exercises tandem walk 12' x 2      Knee/Hip Exercises: Supine   Heel Slides AAROM;Left;10 reps      Vasopneumatic   Number Minutes Vasopneumatic  10 minutes    Vasopnuematic Location  Knee    Vasopneumatic Pressure  Medium    Vasopneumatic Temperature  34                    PT Short Term Goals - 12/05/20 1117      PT SHORT TERM GOAL #1   Title Pt will be independent in his HEP.    Status Achieved      PT SHORT TERM GOAL #2   Title Pt will be able to amb with least restrictive device on level surfaces for 150 feet safely with pain </= 4/10.    Status Achieved             PT Long Term Goals - 12/06/20 0941      PT LONG TERM GOAL #1   Title Pt will improve his FOTO from 47 to >/= 64.    Status On-going      PT LONG TERM GOAL #2   Title Pt will be able to perform left knee flexion to >= 120 degrees.    Status On-going      PT LONG TERM GOAL #3   Title Pt will be able to amb up and down 5 steps  with no hand rail with pain </= 2/10 in order to get in and out of pt's home safely.    Status On-going      PT LONG TERM GOAL #4   Title Pt will be able to improve his left LE strength to grossly 5/5 in order to improve gait and ADL's.    Status On-going      PT LONG TERM GOAL #5   Title Pt will be able to amb with no  assistive device >/= 1000 feet on community level surfaces.    Baseline Pt amb with no assistive device, but reporting fatigue and pain when walking community surfaces.    Status On-going                 Plan - 12/07/20 1016    Clinical Impression Statement Pt with decreased pain reported today and tolerated strengthening and balance work today well with limited complaints.  Still lacking ~ 10 deg of flexion so continuing to work on flexion ROM.  Will continue to benefit from PT to maximize function.    Examination-Activity Limitations Stairs;Squat;Stand;Lift    Examination-Participation Restrictions Other;Occupation;Community Activity;Driving    Stability/Clinical Decision Making Stable/Uncomplicated    Rehab Potential Good    PT Frequency Other (comment)    PT Duration 8 weeks    PT Treatment/Interventions ADLs/Self Care Home Management;Cryotherapy;Electrical Stimulation;Ultrasound;Gait training;Stair training;Functional mobility training;Therapeutic activities;Therapeutic exercise;Balance training;Neuromuscular re-education;Patient/family education;Manual techniques;Passive range of motion;Scar mobilization;Vasopneumatic Device;Taping    PT Next Visit Plan TKA protocol, LE strengtheing, ROM, modalities as needed    PT Home Exercise Plan Access Code: [email or web address]  URL: [email or web address]  Date: 11/09/2020  Prepared by: Narda Amber    Exercises  Long Sitting Quad Set with Towel Roll Under Heel - 3 x daily - 7 x weekly - 2 sets - 10 reps  Supine Active Straight Leg Raise - 3 x daily - 7 x weekly - 2 sets - 10 reps  Supine Heel Slides - 3 x daily - 7 x weekly - 2 sets - 10 reps  Sit to Stand with Counter Support - 3 x daily - 7 x weekly - 2 sets - 10 reps    Consulted and Agree with Plan of Care Patient           Patient will benefit from skilled  therapeutic intervention in order to improve the following deficits and impairments:  Pain,Abnormal gait,Difficulty  walking,Decreased balance,Impaired flexibility,Increased edema,Decreased activity tolerance,Decreased strength,Decreased range of motion,Decreased mobility  Visit Diagnosis: Acute pain of left knee  Stiffness of left knee, not elsewhere classified  Localized edema     Problem List Patient Active Problem List   Diagnosis Date Noted  . Dizziness of unknown cause 10/25/2020  . S/P total knee arthroplasty, left 10/25/2020  . S/p bilateral revision of total hip replacement 10/25/2020  . Primary osteoarthritis of left knee 04/06/2018  . Seasonal allergic rhinitis due to pollen 04/06/2018  . History of Bell's palsy 04/06/2018      Clarita Crane, PT, DPT 12/07/20 10:18 AM    [facility] Physical Therapy [address] [place], [place], [address] Phone: [phone]   Fax:  [phone]  Name: [name] MRN: [id number] Date of Birth: 1970/10/07

## 2020-12-12 ENCOUNTER — Encounter: Payer: BC Managed Care – PPO | Admitting: Physical Therapy

## 2020-12-14 ENCOUNTER — Encounter: Payer: Self-pay | Admitting: Physical Therapy

## 2020-12-14 ENCOUNTER — Ambulatory Visit (INDEPENDENT_AMBULATORY_CARE_PROVIDER_SITE_OTHER): Payer: BC Managed Care – PPO | Admitting: Physical Therapy

## 2020-12-14 ENCOUNTER — Other Ambulatory Visit: Payer: Self-pay

## 2020-12-14 DIAGNOSIS — R600 Localized edema: Secondary | ICD-10-CM | POA: Diagnosis not present

## 2020-12-14 DIAGNOSIS — M25562 Pain in left knee: Secondary | ICD-10-CM

## 2020-12-14 DIAGNOSIS — M25662 Stiffness of left knee, not elsewhere classified: Secondary | ICD-10-CM

## 2020-12-14 NOTE — Therapy (Signed)
[facility] Physical Therapy [address] [place], [place], [address] Phone: [phone]   Fax:  [phone]  Physical Therapy Treatment  Patient Details  Name: [name] MRN: [id number] Date of Birth: [birth year] Referring Provider (PT): [name], MD   Encounter Date: 12/14/2020   PT End of Session - 12/14/20 0935    Visit Number 15    Number of Visits 20    Date for PT Re-Evaluation 01/06/21    Authorization Type 3x/ week progressing to 2x/ week    Progress Note Due on Visit 20    PT Start Time 0930    PT Stop Time 1015    PT Time Calculation (min) 45 min    Equipment Utilized During Treatment Gait belt    Activity Tolerance Patient tolerated treatment well    Behavior During Therapy [facility] for tasks assessed/performed           Past Medical History:  Diagnosis Date  . Arthritis     Past Surgical History:  Procedure Laterality Date  . NO PAST SURGERIES    . TOTAL KNEE ARTHROPLASTY Left 10/25/2020   Procedure: LEFT TOTAL KNEE ARTHROPLASTY;  Surgeon: Valeria Batman, MD;  Location: [facility];  Service: Orthopedics;  Laterality: Left;    There were no vitals filed for this visit.   Subjective Assessment - 12/14/20 0934    Subjective Pain 1/10 today upon arrival. Pt reporting increased swelling last night after long walk with increased pain 5/10.    Pertinent History Left TKA on 10/25/2020.    Limitations Walking;Standing;House hold activities    Diagnostic tests X-ray, hardward intact    Patient Stated Goals Get back to work, bend my knee    Currently in Pain? Yes    Pain Score 1     Pain Location Knee    Pain Orientation Left    Pain Descriptors / Indicators Aching;Tightness    Pain Type Surgical pain    Pain Onset More than a month ago                             [facility] Adult PT Treatment/Exercise - 12/14/20 0001      Knee/Hip Exercises: Stretches   Lobbyist Left;3 reps;30 seconds    Quad Stretch  Limitations standing with UE support using strap      Knee/Hip Exercises: Aerobic   Recumbent Bike full revolutions after 30 seconds, L4 x 6 minutes    Nustep --      Knee/Hip Exercises: Machines for Strengthening   Cybex Knee Extension 5# L LE only 3 x10    Cybex Knee Flexion 25# L LE only 2x10, 35# x 10    Cybex Leg Press bilateral 112# 3 x 10, left LE only 56# 2x10      Knee/Hip Exercises: Standing   SLS SLS on level ground 15 seconds x 4 with intermittent UE support      Vasopneumatic   Number Minutes Vasopneumatic  10 minutes    Vasopnuematic Location  Knee    Vasopneumatic Pressure Medium    Vasopneumatic Temperature  34                    PT Short Term Goals - 12/14/20 9528      PT SHORT TERM GOAL #1   Title Pt will be independent in his HEP.    Status Achieved      PT SHORT TERM GOAL #2  Title Pt will be able to amb with least restrictive device on level surfaces for 150 feet safely with pain </= 4/10.    Status Achieved             PT Long Term Goals - 12/14/20 7035      PT LONG TERM GOAL #1   Title Pt will improve his FOTO from 47 to >/= 64.    Status On-going      PT LONG TERM GOAL #2   Title Pt will be able to perform left knee flexion to >= 120 degrees.    Status On-going      PT LONG TERM GOAL #3   Title Pt will be able to amb up and down 5 steps  with no hand rail with pain </= 2/10 in order to get in and out of pt's home safely.    Status On-going      PT LONG TERM GOAL #4   Title Pt will be able to improve his left LE strength to grossly 5/5 in order to improve gait and ADL's.    Status On-going      PT LONG TERM GOAL #5   Title Pt will be able to amb with no assistive device >/= 1000 feet on community level surfaces.    Baseline no assistive device, but pt reporting 5/10 pain and swelling after walking in his neighborhood    Status On-going                 Plan - 12/14/20 0935    Clinical Impression Statement Pt  tolerating strengthening and balance exercises well with mild increase in pain at end of exercises. Pt repoting 3/10 pain after using recumbant bike and ascending and descending the stairs. Still progressing with left knee flexion Continue skilled PT.    Examination-Activity Limitations Stairs;Squat;Stand;Lift    Examination-Participation Restrictions Other;Occupation;Community Activity;Driving    Stability/Clinical Decision Making Stable/Uncomplicated    Rehab Potential Good    PT Frequency Other (comment)    PT Duration 8 weeks    PT Treatment/Interventions ADLs/Self Care Home Management;Cryotherapy;Electrical Stimulation;Ultrasound;Gait training;Stair training;Functional mobility training;Therapeutic activities;Therapeutic exercise;Balance training;Neuromuscular re-education;Patient/family education;Manual techniques;Passive range of motion;Scar mobilization;Vasopneumatic Device;Taping    PT Next Visit Plan TKA protocol, LE strengtheing, ROM, modalities as needed    PT Home Exercise Plan Access Code: [email or web address]  URL: [email or web address]  Date: 11/09/2020  Prepared by: Narda Amber    Exercises  Long Sitting Quad Set with Towel Roll Under Heel - 3 x daily - 7 x weekly - 2 sets - 10 reps  Supine Active Straight Leg Raise - 3 x daily - 7 x weekly - 2 sets - 10 reps  Supine Heel Slides - 3 x daily - 7 x weekly - 2 sets - 10 reps  Sit to Stand with Counter Support - 3 x daily - 7 x weekly - 2 sets - 10 reps    Consulted and Agree with Plan of Care Patient           Patient will benefit from skilled therapeutic intervention in order to improve the following deficits and impairments:  Pain,Abnormal gait,Difficulty walking,Decreased balance,Impaired flexibility,Increased edema,Decreased activity tolerance,Decreased strength,Decreased range of motion,Decreased mobility  Visit Diagnosis: Acute pain of left knee  Stiffness of left knee, not elsewhere classified  Localized  edema     Problem List Patient Active Problem List   Diagnosis Date Noted  . Dizziness of unknown cause 10/25/2020  . S/P total knee arthroplasty, left  10/25/2020  . S/p bilateral revision of total hip replacement 10/25/2020  . Primary osteoarthritis of left knee 04/06/2018  . Seasonal allergic rhinitis due to pollen 04/06/2018  . History of Bell's palsy 04/06/2018    Sharmon Leyden, PT, MPT 12/14/2020, 9:58 AM  [facility] Physical Therapy [address] [place], [place], [address] Phone: [phone]   Fax:  [phone]  Name: [name] MRN: [id number] Date of Birth: 07-03-1971

## 2020-12-16 ENCOUNTER — Encounter: Payer: BC Managed Care – PPO | Admitting: Physical Therapy

## 2020-12-17 ENCOUNTER — Other Ambulatory Visit: Payer: Self-pay | Admitting: Surgical

## 2020-12-21 ENCOUNTER — Ambulatory Visit (INDEPENDENT_AMBULATORY_CARE_PROVIDER_SITE_OTHER): Payer: BC Managed Care – PPO | Admitting: Physical Therapy

## 2020-12-21 ENCOUNTER — Encounter: Payer: Self-pay | Admitting: Physical Therapy

## 2020-12-21 ENCOUNTER — Other Ambulatory Visit: Payer: Self-pay

## 2020-12-21 DIAGNOSIS — M25662 Stiffness of left knee, not elsewhere classified: Secondary | ICD-10-CM | POA: Diagnosis not present

## 2020-12-21 DIAGNOSIS — M25562 Pain in left knee: Secondary | ICD-10-CM

## 2020-12-21 DIAGNOSIS — R600 Localized edema: Secondary | ICD-10-CM

## 2020-12-21 NOTE — Therapy (Signed)
[facility] Physical Therapy [address] [place], [place], [address] Phone: [phone]   Fax:  [phone]  Physical Therapy Treatment  Patient Details  Name: [name] MRN: [id number] Date of Birth: 11-08-1970 Referring Provider (PT): [name], MD   Encounter Date: 12/21/2020   PT End of Session - 12/21/20 0809    Visit Number 16    Number of Visits 20    Date for PT Re-Evaluation 01/06/21    Authorization Type 3x/ week progressing to 2x/ week    Progress Note Due on Visit 20    PT Start Time 0801    PT Stop Time 0845    PT Time Calculation (min) 44 min    Equipment Utilized During Treatment Gait belt    Activity Tolerance Patient tolerated treatment well    Behavior During Therapy [facility] for tasks assessed/performed           Past Medical History:  Diagnosis Date  . Arthritis     Past Surgical History:  Procedure Laterality Date  . NO PAST SURGERIES    . TOTAL KNEE ARTHROPLASTY Left 10/25/2020   Procedure: LEFT TOTAL KNEE ARTHROPLASTY;  Surgeon: Valeria Batman, MD;  Location: [facility];  Service: Orthopedics;  Laterality: Left;    There were no vitals filed for this visit.   Subjective Assessment - 12/21/20 0809    Subjective Pt reporting no pain upon arrival.    Pertinent History Left TKA on 10/25/2020.    Limitations Walking;Standing;House hold activities    Diagnostic tests X-ray, hardward intact    Patient Stated Goals Get back to work, bend my knee    Currently in Pain? No/denies                             [facility] Adult PT Treatment/Exercise - 12/21/20 0001      Knee/Hip Exercises: Stretches   Lobbyist Left;3 reps;30 seconds    Quad Stretch Limitations standing with UE support using strap      Knee/Hip Exercises: Aerobic   Recumbent Bike full revolutions afte, L3 x 8 minutes (UBE bike using no UE only legs)      Knee/Hip Exercises: Machines for Strengthening   Cybex Knee Extension 10# L LE only 3  x10    Cybex Knee Flexion 35# L LE only 2x10, 35# x 10    Cybex Leg Press bilateral 112# 3 x 10, left LE only 62# 2x10      Knee/Hip Exercises: Standing   SLS SLS on Airex with intermittent finger taps      Vasopneumatic   Number Minutes Vasopneumatic  10 minutes    Vasopnuematic Location  Knee    Vasopneumatic Pressure Medium    Vasopneumatic Temperature  34                    PT Short Term Goals - 12/14/20 2992      PT SHORT TERM GOAL #1   Title Pt will be independent in his HEP.    Status Achieved      PT SHORT TERM GOAL #2   Title Pt will be able to amb with least restrictive device on level surfaces for 150 feet safely with pain </= 4/10.    Status Achieved             PT Long Term Goals - 12/21/20 0810      PT LONG TERM GOAL #1   Title  Pt will improve his FOTO from 47 to >/= 64.    Status On-going      PT LONG TERM GOAL #2   Title Pt will be able to perform left knee flexion to >= 120 degrees.    Baseline AROM: 100 degrees on 12/21/2020    Status On-going      PT LONG TERM GOAL #3   Title Pt will be able to amb up and down 5 steps  with no hand rail with pain </= 2/10 in order to get in and out of pt's home safely.    Status On-going      PT LONG TERM GOAL #4   Title Pt will be able to improve his left LE strength to grossly 5/5 in order to improve gait and ADL's.      PT LONG TERM GOAL #5   Title Pt will be able to amb with no assistive device >/= 1000 feet on community level surfaces.    Status On-going                 Plan - 12/21/20 9977    Clinical Impression Statement Pt tolerating strengthening and dynamic balance activities well with reports on increased pain with flexion based movements.Left knee AROM measured today at 100 degrees flexion  Continue skilled PT progressing toward increased flexion and strength.    Examination-Activity Limitations Stairs;Squat;Stand;Lift    Examination-Participation Restrictions  Other;Occupation;Community Activity;Driving    Stability/Clinical Decision Making Stable/Uncomplicated    Rehab Potential Good    PT Frequency Other (comment)    PT Duration 8 weeks    PT Treatment/Interventions ADLs/Self Care Home Management;Cryotherapy;Electrical Stimulation;Ultrasound;Gait training;Stair training;Functional mobility training;Therapeutic activities;Therapeutic exercise;Balance training;Neuromuscular re-education;Patient/family education;Manual techniques;Passive range of motion;Scar mobilization;Vasopneumatic Device;Taping    PT Next Visit Plan TKA protocol, LE strengtheing, ROM, modalities as needed    PT Home Exercise Plan Access Code: [email or web address]  URL: [email or web address]  Date: 11/09/2020  Prepared by: Narda Amber    Exercises  Long Sitting Quad Set with Towel Roll Under Heel - 3 x daily - 7 x weekly - 2 sets - 10 reps  Supine Active Straight Leg Raise - 3 x daily - 7 x weekly - 2 sets - 10 reps  Supine Heel Slides - 3 x daily - 7 x weekly - 2 sets - 10 reps  Sit to Stand with Counter Support - 3 x daily - 7 x weekly - 2 sets - 10 reps    Consulted and Agree with Plan of Care Patient           Patient will benefit from skilled therapeutic intervention in order to improve the following deficits and impairments:  Pain,Abnormal gait,Difficulty walking,Decreased balance,Impaired flexibility,Increased edema,Decreased activity tolerance,Decreased strength,Decreased range of motion,Decreased mobility  Visit Diagnosis: Acute pain of left knee  Stiffness of left knee, not elsewhere classified  Localized edema     Problem List Patient Active Problem List   Diagnosis Date Noted  . Dizziness of unknown cause 10/25/2020  . S/P total knee arthroplasty, left 10/25/2020  . S/p bilateral revision of total hip replacement 10/25/2020  . Primary osteoarthritis of left knee 04/06/2018  . Seasonal allergic rhinitis due to pollen 04/06/2018  . History of Bell's  palsy 04/06/2018    Sharmon Leyden, PT, MPT 12/21/2020, 8:41 AM  [facility] Physical Therapy [address] [place], [place], [address] Phone: [phone]   Fax:  [phone]  Name: [name] MRN: [id number] Date of Birth: [birth year]

## 2020-12-27 ENCOUNTER — Other Ambulatory Visit: Payer: Self-pay

## 2020-12-27 ENCOUNTER — Ambulatory Visit (INDEPENDENT_AMBULATORY_CARE_PROVIDER_SITE_OTHER): Payer: BC Managed Care – PPO | Admitting: Physical Therapy

## 2020-12-27 DIAGNOSIS — M25662 Stiffness of left knee, not elsewhere classified: Secondary | ICD-10-CM | POA: Diagnosis not present

## 2020-12-27 DIAGNOSIS — R600 Localized edema: Secondary | ICD-10-CM | POA: Diagnosis not present

## 2020-12-27 DIAGNOSIS — M25562 Pain in left knee: Secondary | ICD-10-CM | POA: Diagnosis not present

## 2020-12-27 NOTE — Therapy (Signed)
[facility] Physical Therapy [address] [place], [place], [address] Phone: [phone]   Fax:  [phone]  Physical Therapy Treatment/MD note  Patient Details  Name: [name] MRN: [id number] Date of Birth: 1970-11-02 Referring Provider (PT): [name], MD   Encounter Date: 12/27/2020   PT End of Session - 12/27/20 1001    Visit Number 17    Number of Visits 20    Date for PT Re-Evaluation 01/06/21    Authorization Type 3x/ week progressing to 2x/ week    Progress Note Due on Visit 20    PT Start Time 0930    PT Stop Time 1015    PT Time Calculation (min) 45 min    Activity Tolerance Patient tolerated treatment well    Behavior During Therapy [facility] for tasks assessed/performed           Past Medical History:  Diagnosis Date  . Arthritis     Past Surgical History:  Procedure Laterality Date  . NO PAST SURGERIES    . TOTAL KNEE ARTHROPLASTY Left 10/25/2020   Procedure: LEFT TOTAL KNEE ARTHROPLASTY;  Surgeon: Valeria Batman, MD;  Location: [facility];  Service: Orthopedics;  Laterality: Left;    There were no vitals filed for this visit.   Subjective Assessment - 12/27/20 1012    Subjective Pt reporting about 1/10 overall Lt knee pain.    Pertinent History Left TKA on 10/25/2020.    Limitations Walking;Standing;House hold activities    Diagnostic tests X-ray, hardward intact    Patient Stated Goals Get back to work, bend my knee    Pain Onset More than a month ago              [facility] PT Assessment - 12/27/20 0001      Assessment   Medical Diagnosis s/p L TKA    Referring Provider (PT) [name], MD    Onset Date/Surgical Date 10/25/20    Next MD Visit 12/28/20      AROM   Left Knee Extension 2    Left Knee Flexion 102      PROM   Overall PROM Comments measured in supine    Left Knee Extension 0    Left Knee Flexion 108      Strength   Left Knee Flexion 5/5    Left Knee Extension 5/5                          [facility] Adult PT Treatment/Exercise - 12/27/20 0001      Knee/Hip Exercises: Stretches   Knee: Self-Stretch Limitations standing lunge stretch with Lt foot on 8 in step 5 sec x15 reps    Gastroc Stretch Both;3 reps;30 seconds    Gastroc Stretch Limitations slantboard      Knee/Hip Exercises: Aerobic   Recumbent Bike 8 min L1      Knee/Hip Exercises: Machines for Strengthening   Cybex Knee Extension 10# L LE only 3 x10    Cybex Knee Flexion 35# L LE only 2X10    Cybex Leg Press bilateral 118# 3 x 10, left LE only 68# 2x10      Knee/Hip Exercises: Standing   Forward Step Up Left;10 reps;Hand Hold: 1;Step Height: 8"    SLS SLS on Airex 30 sec X3      Manual Therapy   Manual therapy comments Lt knee PROM and flexion mobs  PT Short Term Goals - 12/14/20 2119      PT SHORT TERM GOAL #1   Title Pt will be independent in his HEP.    Status Achieved      PT SHORT TERM GOAL #2   Title Pt will be able to amb with least restrictive device on level surfaces for 150 feet safely with pain </= 4/10.    Status Achieved             PT Long Term Goals - 12/21/20 0810      PT LONG TERM GOAL #1   Title Pt will improve his FOTO from 47 to >/= 64.    Status On-going      PT LONG TERM GOAL #2   Title Pt will be able to perform left knee flexion to >= 120 degrees.    Baseline AROM: 100 degrees on 12/21/2020    Status On-going      PT LONG TERM GOAL #3   Title Pt will be able to amb up and down 5 steps  with no hand rail with pain </= 2/10 in order to get in and out of pt's home safely.    Status On-going      PT LONG TERM GOAL #4   Title Pt will be able to improve his left LE strength to grossly 5/5 in order to improve gait and ADL's.      PT LONG TERM GOAL #5   Title Pt will be able to amb with no assistive device >/= 1000 feet on community level surfaces.    Status On-going                 Plan - 12/27/20 1014     Clinical Impression Statement He has made good progress overall, now has good strength with MMT, but still missing some funcitonal strength and endurance along with mild knee ROM deficit, see updated measurments. PT recommending 2-3 more weeks of PT to address these remaining defiicts.    Examination-Activity Limitations Stairs;Squat;Stand;Lift    Examination-Participation Restrictions Other;Occupation;Community Activity;Driving    Stability/Clinical Decision Making Stable/Uncomplicated    Rehab Potential Good    PT Frequency Other (comment)    PT Duration 8 weeks    PT Treatment/Interventions ADLs/Self Care Home Management;Cryotherapy;Electrical Stimulation;Ultrasound;Gait training;Stair training;Functional mobility training;Therapeutic activities;Therapeutic exercise;Balance training;Neuromuscular re-education;Patient/family education;Manual techniques;Passive range of motion;Scar mobilization;Vasopneumatic Device;Taping    PT Next Visit Plan TKA protocol, LE strengtheing, ROM, modalities as needed    PT Home Exercise Plan Access Code: [email or web address]  URL: [email or web address]  Date: 11/09/2020  Prepared by: Narda Amber    Exercises  Long Sitting Quad Set with Towel Roll Under Heel - 3 x daily - 7 x weekly - 2 sets - 10 reps  Supine Active Straight Leg Raise - 3 x daily - 7 x weekly - 2 sets - 10 reps  Supine Heel Slides - 3 x daily - 7 x weekly - 2 sets - 10 reps  Sit to Stand with Counter Support - 3 x daily - 7 x weekly - 2 sets - 10 reps    Consulted and Agree with Plan of Care Patient           Patient will benefit from skilled therapeutic intervention in order to improve the following deficits and impairments:  Pain,Abnormal gait,Difficulty walking,Decreased balance,Impaired flexibility,Increased edema,Decreased activity tolerance,Decreased strength,Decreased range of motion,Decreased mobility  Visit Diagnosis: Acute pain of left knee  Stiffness of left knee, not elsewhere  classified  Localized edema     Problem List Patient Active Problem List   Diagnosis Date Noted  . Dizziness of unknown cause 10/25/2020  . S/P total knee arthroplasty, left 10/25/2020  . S/p bilateral revision of total hip replacement 10/25/2020  . Primary osteoarthritis of left knee 04/06/2018  . Seasonal allergic rhinitis due to pollen 04/06/2018  . History of Bell's palsy 04/06/2018    Birdie Riddle 12/27/2020, 10:15 AM  [facility] Physical Therapy [address] [place], [place], [address] Phone: [phone]   Fax:  [phone]  Name: [name] MRN: [id number] Date of Birth: 10/28/1970

## 2020-12-28 ENCOUNTER — Ambulatory Visit (INDEPENDENT_AMBULATORY_CARE_PROVIDER_SITE_OTHER): Payer: BC Managed Care – PPO | Admitting: Orthopaedic Surgery

## 2020-12-28 ENCOUNTER — Encounter: Payer: Self-pay | Admitting: Orthopaedic Surgery

## 2020-12-28 VITALS — Ht 70.0 in | Wt 239.0 lb

## 2020-12-28 DIAGNOSIS — Z96652 Presence of left artificial knee joint: Secondary | ICD-10-CM

## 2020-12-28 NOTE — Progress Notes (Signed)
Office Visit Note   Patient: [name]           Date of Birth: 11/27/1970           MRN: [id number] Visit Date: 12/28/2020              Requested by: Jac Canavan, PA-C [address] [place],  [place] [address] PCP: Jac Canavan, PA-C   Assessment & Plan: Visit Diagnoses:  1. S/P total knee arthroplasty, left     Plan: 2 months status post primary left total knee replacement and seems to be progressing nicely.  Still having a little bit of pain and uses ibuprofen as needed.  Does not use any ambulatory aid.  Denies any fever or chills.  Continues to go to physical therapy several times week.  Range of motion was about -4 to almost 105 degrees without instability.  The knee was warm but it was not tender.  Still some induration around the knee but nothing that I think is unusual.  We will plan to see him back in a month.  Still unable to work.  Continue with therapy  Follow-Up Instructions: Return in about 1 month (around 01/27/2021).   Orders:  No orders of the defined types were placed in this encounter.  No orders of the defined types were placed in this encounter.     Procedures: No procedures performed   Clinical Data: No additional findings.   Subjective: Chief Complaint  Patient presents with  . Left Knee - Follow-up    Left total knee arthroplasty 10/25/2020  Patient presents today for a follow up on his left knee. He had a left total knee arthroplasty on 10/25/2020. He is now two months out from surgery. Patient states that he is going to physical therapy three times weekly. He said that he still experiences some pain, but improving. He takes Ibuprofen as needed.   HPI  Review of Systems   Objective: Vital Signs: Ht 5\' 10"  (1.778 m)   Wt 239 lb (108.4 kg)   BMI 34.29 kg/m   Physical Exam  Ortho Exam left knee was a little warm but no localized areas of tenderness.  He might have a very small effusion.  The still some induration of the  soft tissue.  Has developed an early keloid along the proximal incision which is minimally uncomfortable.  No instability.  I measured about -4 degrees to extension passively with a goniometer and flexed almost 105.  No localized areas of tenderness.  No calf pain.  Neurologically intact  Specialty Comments:  No specialty comments available.  Imaging: No results found.   PMFS History: Patient Active Problem List   Diagnosis Date Noted  . Dizziness of unknown cause 10/25/2020  . S/P total knee arthroplasty, left 10/25/2020  . S/p bilateral revision of total hip replacement 10/25/2020  . Primary osteoarthritis of left knee 04/06/2018  . Seasonal allergic rhinitis due to pollen 04/06/2018  . History of Bell's palsy 04/06/2018   Past Medical History:  Diagnosis Date  . Arthritis     Family History  Problem Relation Age of Onset  . Hypertension Mother   . Prostate cancer Father   . Diabetes Sister     Past Surgical History:  Procedure Laterality Date  . NO PAST SURGERIES    . TOTAL KNEE ARTHROPLASTY Left 10/25/2020   Procedure: LEFT TOTAL KNEE ARTHROPLASTY;  Surgeon: 12/23/2020, MD;  Location: [facility];  Service: Orthopedics;  Laterality: Left;  Social History   Occupational History  . Not on file  Tobacco Use  . Smoking status: Never Smoker  . Smokeless tobacco: Never Used  Vaping Use  . Vaping Use: Never used  Substance and Sexual Activity  . Alcohol use: No  . Drug use: No  . Sexual activity: Not on file

## 2020-12-29 ENCOUNTER — Ambulatory Visit (INDEPENDENT_AMBULATORY_CARE_PROVIDER_SITE_OTHER): Payer: BC Managed Care – PPO | Admitting: Physical Therapy

## 2020-12-29 ENCOUNTER — Other Ambulatory Visit: Payer: Self-pay

## 2020-12-29 ENCOUNTER — Encounter: Payer: Self-pay | Admitting: Physical Therapy

## 2020-12-29 DIAGNOSIS — R600 Localized edema: Secondary | ICD-10-CM | POA: Diagnosis not present

## 2020-12-29 DIAGNOSIS — M25562 Pain in left knee: Secondary | ICD-10-CM

## 2020-12-29 DIAGNOSIS — M25662 Stiffness of left knee, not elsewhere classified: Secondary | ICD-10-CM | POA: Diagnosis not present

## 2020-12-29 NOTE — Therapy (Signed)
[facility] Physical Therapy [address] [place], [place], [address] Phone: [phone]   Fax:  [phone]  Physical Therapy Treatment  Patient Details  Name: [name] MRN: [id number] Date of Birth: 09/25/1970 Referring Provider (PT): [name], MD   Encounter Date: 12/29/2020   PT End of Session - 12/29/20 1056    Visit Number 18    Number of Visits 20    Date for PT Re-Evaluation 01/06/21    Authorization Type 3x/ week progressing to 2x/ week    Progress Note Due on Visit 20    PT Start Time 1015    PT Stop Time 1105    PT Time Calculation (min) 50 min    Activity Tolerance Patient tolerated treatment well    Behavior During Therapy [facility] for tasks assessed/performed           Past Medical History:  Diagnosis Date  . Arthritis     Past Surgical History:  Procedure Laterality Date  . NO PAST SURGERIES    . TOTAL KNEE ARTHROPLASTY Left 10/25/2020   Procedure: LEFT TOTAL KNEE ARTHROPLASTY;  Surgeon: Valeria Batman, MD;  Location: [facility];  Service: Orthopedics;  Laterality: Left;    There were no vitals filed for this visit.   Subjective Assessment - 12/29/20 1033    Subjective Pt reporting about 2/10 overall Lt knee pain and that MD said he was doing good but wants him to come back in 4 weeks    Pertinent History Left TKA on 10/25/2020.    Limitations Walking;Standing;House hold activities    Diagnostic tests X-ray, hardward intact    Patient Stated Goals Get back to work, bend my knee    Pain Onset More than a month ago            [facility] Adult PT Treatment/Exercise - 12/29/20 0001      Knee/Hip Exercises: Stretches   Lobbyist Left;3 reps;30 seconds    Quad Stretch Limitations supine with strap leg off EOB    Gastroc Stretch Both;3 reps;30 seconds    Gastroc Stretch Limitations slantboard      Knee/Hip Exercises: Machines for Strengthening   Cybex Knee Extension 15# up with both then L LE eccentrics 3 x10    Cybex  Knee Flexion 35# L LE only 2X10      Knee/Hip Exercises: Standing   SLS SLS on Airex 30 sec X3    Other Standing Knee Exercises mini lunges with one UE support X10 bilat    Other Standing Knee Exercises squats with UE support 2X10      Vasopneumatic   Number Minutes Vasopneumatic  10 minutes    Vasopnuematic Location  Knee    Vasopneumatic Pressure Medium    Vasopneumatic Temperature  34      Manual Therapy   Manual therapy comments Lt knee PROM, manual hamsting stretch and flexion mobs, extension mobs                    PT Short Term Goals - 12/14/20 6440      PT SHORT TERM GOAL #1   Title Pt will be independent in his HEP.    Status Achieved      PT SHORT TERM GOAL #2   Title Pt will be able to amb with least restrictive device on level surfaces for 150 feet safely with pain </= 4/10.    Status Achieved             PT  Long Term Goals - 12/21/20 0810      PT LONG TERM GOAL #1   Title Pt will improve his FOTO from 47 to >/= 64.    Status On-going      PT LONG TERM GOAL #2   Title Pt will be able to perform left knee flexion to >= 120 degrees.    Baseline AROM: 100 degrees on 12/21/2020    Status On-going      PT LONG TERM GOAL #3   Title Pt will be able to amb up and down 5 steps  with no hand rail with pain </= 2/10 in order to get in and out of pt's home safely.    Status On-going      PT LONG TERM GOAL #4   Title Pt will be able to improve his left LE strength to grossly 5/5 in order to improve gait and ADL's.      PT LONG TERM GOAL #5   Title Pt will be able to amb with no assistive device >/= 1000 feet on community level surfaces.    Status On-going                 Plan - 12/29/20 1057    Clinical Impression Statement we progressed functional strength program and overall knee ROM today as tolerated. He has 2 more visits left on POC and will likely be ready to DC then but we will reassess when we get to that point.    Examination-Activity  Limitations Stairs;Squat;Stand;Lift    Examination-Participation Restrictions Other;Occupation;Community Activity;Driving    Stability/Clinical Decision Making Stable/Uncomplicated    Rehab Potential Good    PT Frequency Other (comment)    PT Duration 8 weeks    PT Treatment/Interventions ADLs/Self Care Home Management;Cryotherapy;Electrical Stimulation;Ultrasound;Gait training;Stair training;Functional mobility training;Therapeutic activities;Therapeutic exercise;Balance training;Neuromuscular re-education;Patient/family education;Manual techniques;Passive range of motion;Scar mobilization;Vasopneumatic Device;Taping    PT Next Visit Plan TKA protocol, LE strengtheing, ROM, modalities as needed    PT Home Exercise Plan Access Code: [email or web address]  URL: [email or web address]  Date: 11/09/2020  Prepared by: Narda Amber    Exercises  Long Sitting Quad Set with Towel Roll Under Heel - 3 x daily - 7 x weekly - 2 sets - 10 reps  Supine Active Straight Leg Raise - 3 x daily - 7 x weekly - 2 sets - 10 reps  Supine Heel Slides - 3 x daily - 7 x weekly - 2 sets - 10 reps  Sit to Stand with Counter Support - 3 x daily - 7 x weekly - 2 sets - 10 reps    Consulted and Agree with Plan of Care Patient           Patient will benefit from skilled therapeutic intervention in order to improve the following deficits and impairments:  Pain,Abnormal gait,Difficulty walking,Decreased balance,Impaired flexibility,Increased edema,Decreased activity tolerance,Decreased strength,Decreased range of motion,Decreased mobility  Visit Diagnosis: Acute pain of left knee  Stiffness of left knee, not elsewhere classified  Localized edema     Problem List Patient Active Problem List   Diagnosis Date Noted  . Dizziness of unknown cause 10/25/2020  . S/P total knee arthroplasty, left 10/25/2020  . S/p bilateral revision of total hip replacement 10/25/2020  . Primary osteoarthritis of left knee 04/06/2018  .  Seasonal allergic rhinitis due to pollen 04/06/2018  . History of Bell's palsy 04/06/2018    Birdie Riddle 12/29/2020, 10:58 AM  [facility] Physical Therapy [address] [place], [place], [address]  Phone: [phone]   Fax:  [phone]  Name: Nikitas Davtyan MRN: [id number] Date of Birth: [birth year]

## 2021-01-02 ENCOUNTER — Ambulatory Visit (INDEPENDENT_AMBULATORY_CARE_PROVIDER_SITE_OTHER): Payer: BC Managed Care – PPO | Admitting: Physical Therapy

## 2021-01-02 ENCOUNTER — Other Ambulatory Visit: Payer: Self-pay

## 2021-01-02 DIAGNOSIS — R600 Localized edema: Secondary | ICD-10-CM | POA: Diagnosis not present

## 2021-01-02 DIAGNOSIS — M25662 Stiffness of left knee, not elsewhere classified: Secondary | ICD-10-CM | POA: Diagnosis not present

## 2021-01-02 DIAGNOSIS — M25562 Pain in left knee: Secondary | ICD-10-CM | POA: Diagnosis not present

## 2021-01-02 NOTE — Therapy (Signed)
[facility] Physical Therapy [address] [place], [place], [address] Phone: [phone]   Fax:  [phone]  Physical Therapy Treatment  Patient Details  Name: [name] MRN: [id number] Date of Birth: 11-19-1970 Referring Provider (PT): [name], MD   Encounter Date: 01/02/2021   PT End of Session - 01/02/21 1109    Visit Number 19    Number of Visits 20    Date for PT Re-Evaluation 01/06/21    Authorization Type 3x/ week progressing to 2x/ week    Progress Note Due on Visit 20    PT Start Time 1012    PT Stop Time 1100    PT Time Calculation (min) 48 min    Activity Tolerance Patient tolerated treatment well    Behavior During Therapy [facility] for tasks assessed/performed           Past Medical History:  Diagnosis Date  . Arthritis     Past Surgical History:  Procedure Laterality Date  . NO PAST SURGERIES    . TOTAL KNEE ARTHROPLASTY Left 10/25/2020   Procedure: LEFT TOTAL KNEE ARTHROPLASTY;  Surgeon: Justin Batman, MD;  Location: [facility];  Service: Orthopedics;  Laterality: Left;    There were no vitals filed for this visit.   Subjective Assessment - 01/02/21 1019    Subjective Pt reporting about 3/10 Lt knee pain and is having more stiffness in it today.    Pertinent History Left TKA on 10/25/2020.    Limitations Walking;Standing;House hold activities    Diagnostic tests X-ray, hardward intact    Patient Stated Goals Get back to work, bend my knee    Pain Onset More than a month ago            [facility] Adult PT Treatment/Exercise - 01/02/21 0001      Knee/Hip Exercises: Stretches   Lobbyist Left;3 reps;30 seconds    Quad Stretch Limitations supine with strap leg off EOB    Knee: Self-Stretch Limitations seated tailgate stretch 10 sec X10    Gastroc Stretch Both;3 reps;30 seconds    Gastroc Stretch Limitations slantboard      Knee/Hip Exercises: Aerobic   Recumbent Bike 10 min L1      Knee/Hip Exercises: Machines for  Strengthening   Cybex Knee Extension 15# up with both then L LE eccentrics 3 x10    Cybex Knee Flexion 35# L LE only 3X10    Cybex Leg Press bilateral 125# 3 x 10, left LE only 68# 2x10      Knee/Hip Exercises: Standing   SLS --    Other Standing Knee Exercises mini lunges with one UE support X10 bilat    Other Standing Knee Exercises --      Vasopneumatic   Number Minutes Vasopneumatic  10 minutes    Vasopnuematic Location  Knee    Vasopneumatic Pressure High    Vasopneumatic Temperature  34      Manual Therapy   Manual therapy comments --                    PT Short Term Goals - 12/14/20 4492      PT SHORT TERM GOAL #1   Title Pt will be independent in his HEP.    Status Achieved      PT SHORT TERM GOAL #2   Title Pt will be able to amb with least restrictive device on level surfaces for 150 feet safely with pain </= 4/10.    Status  Achieved             PT Long Term Goals - 12/21/20 0810      PT LONG TERM GOAL #1   Title Pt will improve his FOTO from 47 to >/= 64.    Status On-going      PT LONG TERM GOAL #2   Title Pt will be able to perform left knee flexion to >= 120 degrees.    Baseline AROM: 100 degrees on 12/21/2020    Status On-going      PT LONG TERM GOAL #3   Title Pt will be able to amb up and down 5 steps  with no hand rail with pain </= 2/10 in order to get in and out of pt's home safely.    Status On-going      PT LONG TERM GOAL #4   Title Pt will be able to improve his left LE strength to grossly 5/5 in order to improve gait and ADL's.      PT LONG TERM GOAL #5   Title Pt will be able to amb with no assistive device >/= 1000 feet on community level surfaces.    Status On-going                 Plan - 01/02/21 1110    Clinical Impression Statement He is still having pain and stiffness, will likely take a little more time to improve his functional level.    Examination-Activity Limitations Stairs;Squat;Stand;Lift     Examination-Participation Restrictions Other;Occupation;Community Activity;Driving    Stability/Clinical Decision Making Stable/Uncomplicated    Rehab Potential Good    PT Frequency Other (comment)    PT Duration 8 weeks    PT Treatment/Interventions ADLs/Self Care Home Management;Cryotherapy;Electrical Stimulation;Ultrasound;Gait training;Stair training;Functional mobility training;Therapeutic activities;Therapeutic exercise;Balance training;Neuromuscular re-education;Patient/family education;Manual techniques;Passive range of motion;Scar mobilization;Vasopneumatic Device;Taping    PT Next Visit Plan TKA protocol, LE strengtheing, ROM, modalities as needed    PT Home Exercise Plan Access Code: [email or web address]  URL: [email or web address]  Date: 11/09/2020  Prepared by: Narda Amber    Exercises  Long Sitting Quad Set with Towel Roll Under Heel - 3 x daily - 7 x weekly - 2 sets - 10 reps  Supine Active Straight Leg Raise - 3 x daily - 7 x weekly - 2 sets - 10 reps  Supine Heel Slides - 3 x daily - 7 x weekly - 2 sets - 10 reps  Sit to Stand with Counter Support - 3 x daily - 7 x weekly - 2 sets - 10 reps    Consulted and Agree with Plan of Care Patient           Patient will benefit from skilled therapeutic intervention in order to improve the following deficits and impairments:  Pain,Abnormal gait,Difficulty walking,Decreased balance,Impaired flexibility,Increased edema,Decreased activity tolerance,Decreased strength,Decreased range of motion,Decreased mobility  Visit Diagnosis: Acute pain of left knee  Stiffness of left knee, not elsewhere classified  Localized edema     Problem List Patient Active Problem List   Diagnosis Date Noted  . Dizziness of unknown cause 10/25/2020  . S/P total knee arthroplasty, left 10/25/2020  . S/p bilateral revision of total hip replacement 10/25/2020  . Primary osteoarthritis of left knee 04/06/2018  . Seasonal allergic rhinitis due to  pollen 04/06/2018  . History of Bell's palsy 04/06/2018    Birdie Riddle 01/02/2021, 11:22 AM  [facility] Physical Therapy [address] [place], [place], [address] Phone: [phone]   Fax:  [phone]  Name: [name] MRN: [id number] Date of Birth: Dec 19, 1970

## 2021-01-03 ENCOUNTER — Other Ambulatory Visit: Payer: Self-pay | Admitting: Surgical

## 2021-01-03 NOTE — Telephone Encounter (Signed)
Please advise 

## 2021-01-04 ENCOUNTER — Other Ambulatory Visit: Payer: Self-pay

## 2021-01-04 ENCOUNTER — Ambulatory Visit (INDEPENDENT_AMBULATORY_CARE_PROVIDER_SITE_OTHER): Payer: BC Managed Care – PPO | Admitting: Physical Therapy

## 2021-01-04 DIAGNOSIS — M25562 Pain in left knee: Secondary | ICD-10-CM | POA: Diagnosis not present

## 2021-01-04 DIAGNOSIS — R600 Localized edema: Secondary | ICD-10-CM

## 2021-01-04 DIAGNOSIS — M25662 Stiffness of left knee, not elsewhere classified: Secondary | ICD-10-CM | POA: Diagnosis not present

## 2021-01-04 NOTE — Therapy (Signed)
[facility] Physical Therapy [address] [place], [place], [address] Phone: [phone]   Fax:  [phone]  Physical Therapy Treatment/Discharge PHYSICAL THERAPY DISCHARGE SUMMARY  Visits from Start of Care: 20  Current functional level related to goals / functional outcomes: See below   Remaining deficits: See below   Education / Equipment: HEP Plan: Patient agrees to discharge.  Patient goals were met. Patient is being discharged due to meeting the stated rehab goals.  ?????        Patient Details  Name: [name] MRN: [id number] Date of Birth: 1971-02-08 Referring Provider (PT): Joni Fears, MD   Encounter Date: 01/04/2021   PT End of Session - 01/04/21 0918    Visit Number 20    Number of Visits 20    Date for PT Re-Evaluation 01/06/21    Authorization Type 3x/ week progressing to 2x/ week    Progress Note Due on Visit 20    PT Start Time 0850    PT Stop Time 0940    PT Time Calculation (min) 50 min    Activity Tolerance Patient tolerated treatment well    Behavior During Therapy [facility] for tasks assessed/performed           Past Medical History:  Diagnosis Date  . Arthritis     Past Surgical History:  Procedure Laterality Date  . NO PAST SURGERIES    . TOTAL KNEE ARTHROPLASTY Left 10/25/2020   Procedure: LEFT TOTAL KNEE ARTHROPLASTY;  Surgeon: Garald Balding, MD;  Location: [facility];  Service: Orthopedics;  Laterality: Left;    There were no vitals filed for this visit.   Subjective Assessment - 01/04/21 0908    Subjective Pt reporting his knee feels better than it did last session, 2/10 pain overall    Pertinent History Left TKA on 10/25/2020.    Limitations Walking;Standing;House hold activities    Diagnostic tests X-ray, hardward intact    Patient Stated Goals Get back to work, bend my knee    Pain Onset More than a month ago              [facility] PT Assessment - 01/04/21 0001      Assessment   Medical  Diagnosis s/p L TKA    Referring Provider (PT) Joni Fears, MD    Onset Date/Surgical Date 10/25/20      Observation/Other Assessments   Focus on Therapeutic Outcomes (FOTO)  65%, met goal      AROM   Left Knee Extension 2    Left Knee Flexion 110      PROM   Left Knee Extension 0    Left Knee Flexion 113      Strength   Left Knee Flexion 5/5    Left Knee Extension 5/5                         [facility] Adult PT Treatment/Exercise - 01/04/21 0001      Knee/Hip Exercises: Stretches   Quad Stretch Left;3 reps;30 seconds    Quad Stretch Limitations supine with strap leg off EOB    Knee: Self-Stretch Limitations seated tailgate stretch 10 sec X10    Gastroc Stretch Both;3 reps;30 seconds    Gastroc Stretch Limitations slantboard      Knee/Hip Exercises: Aerobic   Recumbent Bike 10 min L1      Knee/Hip Exercises: Machines for Strengthening   Cybex Knee Extension 15# up with both then L LE eccentrics 3  x10    Cybex Knee Flexion 35# L LE only 3X10    Cybex Leg Press bilateral 131# 3 x 10, left LE only 68# 2x10      Knee/Hip Exercises: Standing   Forward Step Up Left;10 reps;Hand Hold: 1;Step Height: 8"    Functional Squat Limitations 15 reps as deep as he can with UE support      Vasopneumatic   Number Minutes Vasopneumatic  10 minutes    Vasopnuematic Location  Knee    Vasopneumatic Pressure High    Vasopneumatic Temperature  34      Manual Therapy   Manual therapy comments Lt knee PROM, flexion mobs                    PT Short Term Goals - 12/14/20 2774      PT SHORT TERM GOAL #1   Title Pt will be independent in his HEP.    Status Achieved      PT SHORT TERM GOAL #2   Title Pt will be able to amb with least restrictive device on level surfaces for 150 feet safely with pain </= 4/10.    Status Achieved             PT Long Term Goals - 01/04/21 0936      PT LONG TERM GOAL #1   Title Pt will improve his FOTO from 47 to >/= 64.     Baseline 65 today    Status Achieved      PT LONG TERM GOAL #2   Title Pt will be able to perform left knee flexion to >= 120 degrees.    Baseline 113 deg flexion    Status Partially Met      PT LONG TERM GOAL #3   Title Pt will be able to amb up and down 5 steps  with no hand rail with pain </= 2/10 in order to get in and out of pt's home safely.    Status Achieved      PT LONG TERM GOAL #4   Title Pt will be able to improve his left LE strength to grossly 5/5 in order to improve gait and ADL's.    Status Achieved      PT LONG TERM GOAL #5   Title Pt will be able to amb with no assistive device >/= 1000 feet on community level surfaces.    Status Achieved                 Plan - 01/04/21 0948    Clinical Impression Statement He has attended 20 PT visits and made great progress with PT and now has met all of his PT goals except missing very minimal knee flexion ROM (110 AROM, 113 PROM). PT feels he is ready to transition to independent rehab at home and he should still be able to gain the last little bit of ROM he is missing. He agrees to this plan and will be discharged today. He was highly encouraged to continue his HEP. He had no further questions or concerns.    Examination-Activity Limitations Stairs;Squat;Stand;Lift    Examination-Participation Restrictions Other;Occupation;Community Activity;Driving    Stability/Clinical Decision Making Stable/Uncomplicated    Rehab Potential Good    PT Frequency Other (comment)    PT Duration 8 weeks    PT Treatment/Interventions ADLs/Self Care Home Management;Cryotherapy;Electrical Stimulation;Ultrasound;Gait training;Stair training;Functional mobility training;Therapeutic activities;Therapeutic exercise;Balance training;Neuromuscular re-education;Patient/family education;Manual techniques;Passive range of motion;Scar mobilization;Vasopneumatic Device;Taping    PT  Next Visit Plan TKA protocol, LE strengtheing, ROM, modalities as  needed    PT Home Exercise Plan Access Code: [email or web address]  URL: [email or web address]  Date: 11/09/2020  Prepared by: Kearney Hard    Exercises  Long Sitting Quad Set with Towel Roll Under Heel - 3 x daily - 7 x weekly - 2 sets - 10 reps  Supine Active Straight Leg Raise - 3 x daily - 7 x weekly - 2 sets - 10 reps  Supine Heel Slides - 3 x daily - 7 x weekly - 2 sets - 10 reps  Sit to Stand with Counter Support - 3 x daily - 7 x weekly - 2 sets - 10 reps    Consulted and Agree with Plan of Care Patient           Patient will benefit from skilled therapeutic intervention in order to improve the following deficits and impairments:  Pain,Abnormal gait,Difficulty walking,Decreased balance,Impaired flexibility,Increased edema,Decreased activity tolerance,Decreased strength,Decreased range of motion,Decreased mobility  Visit Diagnosis: Acute pain of left knee  Stiffness of left knee, not elsewhere classified  Localized edema     Problem List Patient Active Problem List   Diagnosis Date Noted  . Dizziness of unknown cause 10/25/2020  . S/P total knee arthroplasty, left 10/25/2020  . S/p bilateral revision of total hip replacement 10/25/2020  . Primary osteoarthritis of left knee 04/06/2018  . Seasonal allergic rhinitis due to pollen 04/06/2018  . History of Bell's palsy 04/06/2018    Silvestre Mesi 01/04/2021, 9:51 AM  [facility] Physical Therapy [address] [place], [place], [address] Phone: [phone]   Fax:  [phone]  Name: [name] MRN: [id number] Date of Birth: 24-Nov-1970

## 2021-01-26 ENCOUNTER — Ambulatory Visit (INDEPENDENT_AMBULATORY_CARE_PROVIDER_SITE_OTHER): Payer: BC Managed Care – PPO | Admitting: Orthopaedic Surgery

## 2021-01-26 ENCOUNTER — Other Ambulatory Visit: Payer: Self-pay

## 2021-01-26 ENCOUNTER — Encounter: Payer: Self-pay | Admitting: Orthopaedic Surgery

## 2021-01-26 DIAGNOSIS — Z96652 Presence of left artificial knee joint: Secondary | ICD-10-CM

## 2021-01-26 NOTE — Progress Notes (Signed)
Office Visit Note   Patient: [name]           Date of Birth: Mar 20, 1971           MRN: [id number] Visit Date: 01/26/2021              Requested by: Jac Canavan, PA-C [address] [place],  [place] [address] PCP: Jac Canavan, PA-C   Assessment & Plan: Visit Diagnoses:  1. S/P total knee arthroplasty, left     Plan: Just over 3 months status post primary left total knee replacement and doing well.  Believes that he is much better than he was before surgery.  Not using any ambulatory aid or taking any routine medicines.  Has finished a course of physical therapy and working on strengthening exercises.  We have discussed activity to tolerance.  He is ready to go back to work and will not require a note. works as an Personnel officer.  Office 3 months  Follow-Up Instructions: Return in about 3 months (around 04/28/2021).   Orders:  No orders of the defined types were placed in this encounter.  No orders of the defined types were placed in this encounter.     Procedures: No procedures performed   Clinical Data: No additional findings.   Subjective: Chief Complaint  Patient presents with  . Left Knee - Routine Post Op  Just over 3 months status post primary left total knee replacement and very happy with the results.  Does not use any ambulatory aid.  Working on exercises.  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  Will not need a note.  Not taking any routine medicines.  Not having any trouble sleeping.  HPI  Review of Systems   Objective: Vital Signs: There were no vitals taken for this visit.  Physical Exam Constitutional:      Appearance: He is well-developed.  Eyes:     Pupils: Pupils are equal, round, and reactive to light.  Pulmonary:     Effort: Pulmonary effort is normal.  Skin:    General: Skin is warm and dry.  Neurological:     Mental Status: He is alert and oriented to person, place, and time.  Psychiatric:         Behavior: Behavior normal.     Ortho Exam awake alert and oriented x3.  Comfortable sitting left knee without any induration but there are some hypertrophic changes.  Has developed keloids but no pain.  Full extension I had him flex over 105 degrees with a goniometer.  No opening with a varus or valgus stress.  No popping clicking or localized areas of tenderness.  No obvious effusion.  No calf pain  Specialty Comments:  No specialty comments available.  Imaging: No results found.   PMFS History: Patient Active Problem List   Diagnosis Date Noted  . Dizziness of unknown cause 10/25/2020  . S/P total knee arthroplasty, left 10/25/2020  . S/p bilateral revision of total hip replacement 10/25/2020  . Primary osteoarthritis of left knee 04/06/2018  . Seasonal allergic rhinitis due to pollen 04/06/2018  . History of Bell's palsy 04/06/2018   Past Medical History:  Diagnosis Date  . Arthritis     Family History  Problem Relation Age of Onset  . Hypertension Mother   . Prostate cancer Father   . Diabetes Sister     Past Surgical History:  Procedure Laterality Date  . NO PAST SURGERIES    .  TOTAL KNEE ARTHROPLASTY Left 10/25/2020   Procedure: LEFT TOTAL KNEE ARTHROPLASTY;  Surgeon: Valeria Batman, MD;  Location: [facility];  Service: Orthopedics;  Laterality: Left;   Social History   Occupational History  . Not on file  Tobacco Use  . Smoking status: Never Smoker  . Smokeless tobacco: Never Used  Vaping Use  . Vaping Use: Never used  Substance and Sexual Activity  . Alcohol use: No  . Drug use: No  . Sexual activity: Not on file     Valeria Batman, MD   Note - This record has been created using [employer].  Chart creation errors have been sought, but may not always  have been located. Such creation errors do not reflect on  the standard of medical care.

## 2021-04-27 ENCOUNTER — Encounter: Payer: Self-pay | Admitting: Orthopaedic Surgery

## 2021-04-27 ENCOUNTER — Ambulatory Visit (INDEPENDENT_AMBULATORY_CARE_PROVIDER_SITE_OTHER): Payer: BLUE CROSS/BLUE SHIELD | Admitting: Orthopaedic Surgery

## 2021-04-27 ENCOUNTER — Other Ambulatory Visit: Payer: Self-pay

## 2021-04-27 VITALS — Ht 70.0 in | Wt 239.0 lb

## 2021-04-27 DIAGNOSIS — Z96652 Presence of left artificial knee joint: Secondary | ICD-10-CM

## 2021-04-27 NOTE — Progress Notes (Signed)
Office Visit Note   Patient: [name]           Date of Birth: 11/13/1970           MRN: [id number] Visit Date: 04/27/2021              Requested by: Jac Canavan, PA-C [address] [place],  [place] [address] PCP: Jac Canavan, PA-C   Assessment & Plan: Visit Diagnoses:  1. S/P total knee arthroplasty, left     Plan: 66-month status post primary left total knee replacement.  Still having some trouble at the end of the day with burning and achiness and a little bit of swelling.  He is working.  He has not been exercising.  I measured the circumference of both thighs and he is about 5 to 6 cm smaller on the left than the is on the right.  I feel sure that the lack of strength is causing him trouble towards the end of the day.  We had a long discussion regarding using an exercise bike and weights.  His son accompanies him.  We will plan to see him back in 3 months  Follow-Up Instructions: Return in about 3 months (around 07/28/2021).   Orders:  No orders of the defined types were placed in this encounter.  No orders of the defined types were placed in this encounter.     Procedures: No procedures performed   Clinical Data: No additional findings.   Subjective: Chief Complaint  Patient presents with   Left Knee - Follow-up    Left total knee arthroplasty 10/25/2020  Patient presents today for follow up on his left knee. He is now 6 months out from a left total knee arthroplasty. His surgery was on 10/25/2020. Patient states that he is still experiencing pain and swelling. He said that it "is a little better, but not really".  No related fever or chills.  No use of ambulatory aid.  Denies shortness of breath or chest pain  HPI  Review of Systems   Objective: Vital Signs: Ht 5\' 10"  (1.778 m)   Wt 239 lb (108.4 kg)   BMI 34.29 kg/m   Physical Exam Constitutional:      Appearance: He is well-developed.  Eyes:     Pupils: Pupils are equal, round, and  reactive to light.  Pulmonary:     Effort: Pulmonary effort is normal.  Skin:    General: Skin is warm and dry.  Neurological:     Mental Status: He is alert and oriented to person, place, and time.  Psychiatric:        Behavior: Behavior normal.    Ortho Exam awake alert and oriented x3.  Comfortable sitting.  Full extension left knee and flexed about 105 degrees.  He had about 2 mm opening medially with a valgus stress and no opening laterally with a varus stress.  Negative anterior drawer sign.  The knee was a little warm but there was no fluid and no localized areas of tenderness.  I measured the circumference of both thighs and about 6 cm less on the left side.  Neurologically intact.  He does not have the muscle tone on the left that he does on the right  Specialty Comments:  No specialty comments available.  Imaging: No results found.   PMFS History: Patient Active Problem List   Diagnosis Date Noted   Dizziness of unknown cause 10/25/2020   S/P total knee arthroplasty, left 10/25/2020  S/p bilateral revision of total hip replacement 10/25/2020   Primary osteoarthritis of left knee 04/06/2018   Seasonal allergic rhinitis due to pollen 04/06/2018   History of Bell's palsy 04/06/2018   Past Medical History:  Diagnosis Date   Arthritis     Family History  Problem Relation Age of Onset   Hypertension Mother    Prostate cancer Father    Diabetes Sister     Past Surgical History:  Procedure Laterality Date   NO PAST SURGERIES     TOTAL KNEE ARTHROPLASTY Left 10/25/2020   Procedure: LEFT TOTAL KNEE ARTHROPLASTY;  Surgeon: Valeria Batman, MD;  Location: [facility];  Service: Orthopedics;  Laterality: Left;   Social History   Occupational History   Not on file  Tobacco Use   Smoking status: Never   Smokeless tobacco: Never  Vaping Use   Vaping Use: Never used  Substance and Sexual Activity   Alcohol use: No   Drug use: No   Sexual activity: Not on file

## 2021-08-01 ENCOUNTER — Ambulatory Visit: Payer: BLUE CROSS/BLUE SHIELD | Admitting: Orthopaedic Surgery

## 2021-08-31 ENCOUNTER — Ambulatory Visit (INDEPENDENT_AMBULATORY_CARE_PROVIDER_SITE_OTHER): Payer: Self-pay | Admitting: Orthopaedic Surgery

## 2021-08-31 ENCOUNTER — Other Ambulatory Visit: Payer: Self-pay

## 2021-08-31 ENCOUNTER — Ambulatory Visit (INDEPENDENT_AMBULATORY_CARE_PROVIDER_SITE_OTHER): Payer: Self-pay

## 2021-08-31 ENCOUNTER — Encounter: Payer: Self-pay | Admitting: Orthopaedic Surgery

## 2021-08-31 DIAGNOSIS — M79671 Pain in right foot: Secondary | ICD-10-CM

## 2021-08-31 DIAGNOSIS — M722 Plantar fascial fibromatosis: Secondary | ICD-10-CM

## 2021-08-31 DIAGNOSIS — Z96652 Presence of left artificial knee joint: Secondary | ICD-10-CM

## 2021-08-31 NOTE — Progress Notes (Signed)
Office Visit Note   Patient: [name]           Date of Birth: 01-20-1971           MRN: [id number] Visit Date: 08/31/2021              Requested by: Jac Canavan, PA-C [address] [place],  [place] [address] PCP: Jac Canavan, PA-C   Assessment & Plan: Visit Diagnoses:  1. Pain of right heel   2. Plantar fasciitis, right   3. S/P total knee arthroplasty, left     Plan: [name] is approximate 9 months status post left total knee replacement doing well.  He does have an occasional click otherwise notes he is doing just fine.  He is working full-time as an Personnel officer and not using any ambulatory aid or using any medicine.  Range of motion is excellent from 0 to 105 degrees and there is no instability or localized tenderness.  The knee was not hot warm or red.  Continue working on his exercises plan to see him back in the office as needed.  He also has plantar pain right heel consistent with plantar fasciitis.  We have instructed him on appropriate icing stretching and I will apply a heel cup for his shoe.  Have him return in a month or 6 weeks and if no improvement consider local cortisone injection  Follow-Up Instructions: Return if symptoms worsen or fail to improve.   Orders:  Orders Placed This Encounter  Procedures   XR Os Calcis Right   No orders of the defined types were placed in this encounter.     Procedures: No procedures performed   Clinical Data: No additional findings.   Subjective: Chief Complaint  Patient presents with   Left Knee - Follow-up, Pain   Right Foot - Pain  72-month status post primary left total knee replacement doing quite well.  Not experiencing any appreciable pain.  Working full-time as an Personnel officer.  Not using any ambulatory aid or using any medicines.  Happy with the results.  Has developed some pain over the past several months on the plantar aspect of his right heel there is been no numbness or tingling or  injury.  HPI  Review of Systems   Objective: Vital Signs: There were no vitals taken for this visit.  Physical Exam Constitutional:      Appearance: He is well-developed.  Eyes:     Pupils: Pupils are equal, round, and reactive to light.  Pulmonary:     Effort: Pulmonary effort is normal.  Skin:    General: Skin is warm and dry.  Neurological:     Mental Status: He is alert and oriented to person, place, and time.  Psychiatric:        Behavior: Behavior normal.    Ortho Exam awake alert and oriented x3.  Comfortable sitting left knee with full quick extension of flexed 105 degrees with a goniometer.  There is no opening with varus or valgus stress.  No localized areas of tenderness.  Scar is healed nicely with a little bit of widening but no pain.  There is no effusion.  No calf pain.  Motor exam intact.  Right heel with some tenderness near the plantar fascial attachment to the os calcis.  Skin intact.  Has some pronation.  Specialty Comments:  No specialty comments available.  Imaging: XR Os Calcis Right  Result Date: 08/31/2021 Films of the right heel were obtained in  2 projections.  There is a small plantar spur otherwise os calcis is intact.  There is no posterior calcaneal spurring.  Calcaneocuboid joint intact.    PMFS History: Patient Active Problem List   Diagnosis Date Noted   Plantar fasciitis, right 08/31/2021   Dizziness of unknown cause 10/25/2020   S/P total knee arthroplasty, left 10/25/2020   S/p bilateral revision of total hip replacement 10/25/2020   Primary osteoarthritis of left knee 04/06/2018   Seasonal allergic rhinitis due to pollen 04/06/2018   History of Bell's palsy 04/06/2018   Past Medical History:  Diagnosis Date   Arthritis     Family History  Problem Relation Age of Onset   Hypertension Mother    Prostate cancer Father    Diabetes Sister     Past Surgical History:  Procedure Laterality Date   NO PAST SURGERIES     TOTAL  KNEE ARTHROPLASTY Left 10/25/2020   Procedure: LEFT TOTAL KNEE ARTHROPLASTY;  Surgeon: Valeria Batman, MD;  Location: [facility];  Service: Orthopedics;  Laterality: Left;   Social History   Occupational History   Not on file  Tobacco Use   Smoking status: Never   Smokeless tobacco: Never  Vaping Use   Vaping Use: Never used  Substance and Sexual Activity   Alcohol use: No   Drug use: No   Sexual activity: Not on file     Valeria Batman, MD   Note - This record has been created using Animal nutritionist.  Chart creation errors have been sought, but may not always  have been located. Such creation errors do not reflect on  the standard of medical care.

## 2021-09-27 ENCOUNTER — Other Ambulatory Visit: Payer: Self-pay

## 2021-09-27 ENCOUNTER — Encounter: Payer: Self-pay | Admitting: Nurse Practitioner

## 2021-09-27 ENCOUNTER — Ambulatory Visit (INDEPENDENT_AMBULATORY_CARE_PROVIDER_SITE_OTHER): Payer: Self-pay | Admitting: Nurse Practitioner

## 2021-09-27 VITALS — BP 129/83 | HR 70 | Ht 66.0 in | Wt 267.0 lb

## 2021-09-27 DIAGNOSIS — Z0000 Encounter for general adult medical examination without abnormal findings: Secondary | ICD-10-CM

## 2021-09-27 DIAGNOSIS — Z8042 Family history of malignant neoplasm of prostate: Secondary | ICD-10-CM

## 2021-09-27 LAB — POCT GLYCOSYLATED HEMOGLOBIN (HGB A1C)
HbA1c POC (<> result, manual entry): 5.7 % (ref 4.0–5.6)
HbA1c, POC (controlled diabetic range): 5.7 % (ref 0.0–7.0)
HbA1c, POC (prediabetic range): 5.7 % (ref 5.7–6.4)
Hemoglobin A1C: 5.7 % — AB (ref 4.0–5.6)

## 2021-09-27 LAB — POCT URINALYSIS DIP (CLINITEK)
Bilirubin, UA: NEGATIVE
Blood, UA: NEGATIVE
Glucose, UA: NEGATIVE mg/dL
Ketones, POC UA: NEGATIVE mg/dL
Leukocytes, UA: NEGATIVE
Nitrite, UA: NEGATIVE
POC PROTEIN,UA: NEGATIVE
Spec Grav, UA: 1.025 (ref 1.010–1.025)
Urobilinogen, UA: 0.2 E.U./dL
pH, UA: 7 (ref 5.0–8.0)

## 2021-09-27 LAB — GLUCOSE, POCT (MANUAL RESULT ENTRY): POC Glucose: 109 mg/dl — AB (ref 70–99)

## 2021-09-27 NOTE — Progress Notes (Signed)
[facility] [address] [place], [place]  [address] Phone:  [phone]   Fax:  [phone] Subjective:   Patient ID: [name], male    DOB: 02-28-1971, 51 y.o.   MRN: [id number]  Chief Complaint  Patient presents with   New Patient (Initial Visit)   HPI [name] 51 y.o. male  has a past medical history of Arthritis. [facility]. Patient states that his last visit with PCP was several years ago.  Currently works as an Personnel officerelectrician. Does mot adhere to any diet and/ exercise regimen, states that he is still recovering from knee replacement he had one year ago. Denies any medical history. States that his father was diagnosed with prostate cancer at 52 y/o, denies any other significant medical history. Denies any substance and/ alcohol usage. Denies any other complaints today.  Denies any fatigue, chest pain, shortness of breath, HA or dizziness. Denies any blurred vision, numbness or tingling.  Past Medical History:  Diagnosis Date   Arthritis     Past Surgical History:  Procedure Laterality Date   NO PAST SURGERIES     TOTAL KNEE ARTHROPLASTY Left 10/25/2020   Procedure: LEFT TOTAL KNEE ARTHROPLASTY;  Surgeon: Valeria BatmanWhitfield, Peter W, MD;  Location: [facility];  Service: Orthopedics;  Laterality: Left;    Family History  Problem Relation Age of Onset   Hypertension Mother    Prostate cancer Father    Diabetes Sister     Social History   Socioeconomic History   Marital status: Married    Spouse name: Not on file   Number of children: Not on file   Years of education: Not on file   Highest education level: Not on file  Occupational History   Not on file  Tobacco Use   Smoking status: Never   Smokeless tobacco: Never  Vaping Use   Vaping Use: Never used  Substance and Sexual Activity   Alcohol use: No   Drug use: No   Sexual activity: Not on file  Other Topics Concern   Not on file  Social History Narrative   Not  on file   Social Determinants of Health   Financial Resource Strain: Not on file  Food Insecurity: Not on file  Transportation Needs: Not on file  Physical Activity: Not on file  Stress: Not on file  Social Connections: Not on file  Intimate Partner Violence: Not on file    Outpatient Medications Prior to Visit  Medication Sig Dispense Refill   [facility] ASPIRIN ADULT LOW DOSE 81 MG chewable tablet CHEW 1 TABLET BY MOUTH EVERY DAY (Patient not taking: Reported on 09/27/2021) 90 tablet 1   methocarbamol (ROBAXIN) 500 MG tablet Take 1 tablet (500 mg total) by mouth every 8 (eight) hours as needed for muscle spasms. (Patient not taking: Reported on 09/27/2021) 30 tablet 0   Multiple Vitamin (MULTIVITAMIN WITH MINERALS) TABS tablet Take 1 tablet by mouth daily. (Patient not taking: Reported on 09/27/2021)     naproxen sodium (ALEVE) 220 MG tablet Take 220 mg by mouth daily as needed (pain). (Patient not taking: Reported on 09/27/2021)     oxyCODONE-acetaminophen (PERCOCET) 5-325 MG tablet Take 1 tablet by mouth every 4 (four) hours as needed for severe pain. (Patient not taking: Reported on 09/27/2021) 30 tablet 0   oxyCODONE-acetaminophen (PERCOCET/ROXICET) 5-325 MG tablet Take 1 tablet by mouth every 6 (six) hours as needed for severe pain. (Patient not taking: Reported on 09/27/2021)  30 tablet 0   No facility-administered medications prior to visit.    No Known Allergies  Review of Systems  Constitutional:  Negative for chills, fever and malaise/fatigue.  HENT: Negative.    Eyes: Negative.   Respiratory:  Negative for cough and shortness of breath.   Cardiovascular:  Negative for chest pain, palpitations and leg swelling.  Gastrointestinal:  Negative for abdominal pain, blood in stool, constipation, diarrhea, nausea and vomiting.  Genitourinary: Negative.   Musculoskeletal:  Positive for joint pain. Negative for back pain, falls, myalgias and neck pain.  Skin: Negative.   Neurological:  Negative.   Psychiatric/Behavioral:  Negative for depression. The patient is not nervous/anxious.   All other systems reviewed and are negative.     Objective:    Physical Exam Vitals reviewed.  Constitutional:      General: He is not in acute distress.    Appearance: Normal appearance. He is obese.  HENT:     Head: Normocephalic.     Right Ear: Tympanic membrane, ear canal and external ear normal.     Left Ear: Tympanic membrane, ear canal and external ear normal.     Nose: Nose normal. No congestion.     Mouth/Throat:     Mouth: Mucous membranes are moist.     Pharynx: Oropharynx is clear. No oropharyngeal exudate or posterior oropharyngeal erythema.  Eyes:     Extraocular Movements: Extraocular movements intact.     Conjunctiva/sclera: Conjunctivae normal.     Pupils: Pupils are equal, round, and reactive to light.  Neck:     Vascular: No carotid bruit.  Cardiovascular:     Rate and Rhythm: Normal rate and regular rhythm.     Pulses: Normal pulses.     Heart sounds: Normal heart sounds.     Comments: No obvious peripheral edema Pulmonary:     Effort: Pulmonary effort is normal.     Breath sounds: Normal breath sounds.  Abdominal:     General: Abdomen is flat. Bowel sounds are normal. There is no distension.     Palpations: Abdomen is soft. There is no mass.     Tenderness: There is no abdominal tenderness. There is no right CVA tenderness, left CVA tenderness, guarding or rebound.     Hernia: No hernia is present.  Musculoskeletal:        General: No swelling, tenderness, deformity or signs of injury. Normal range of motion.     Cervical back: Normal range of motion and neck supple. No rigidity or tenderness.     Right lower leg: No edema.     Left lower leg: No edema.  Lymphadenopathy:     Cervical: No cervical adenopathy.  Skin:    General: Skin is warm and dry.     Capillary Refill: Capillary refill takes less than 2 seconds.  Neurological:     General: No  focal deficit present.     Mental Status: He is alert and oriented to person, place, and time.  Psychiatric:        Mood and Affect: Mood normal.        Behavior: Behavior normal.        Thought Content: Thought content normal.        Judgment: Judgment normal.    BP 129/83    Pulse 70    Ht 5\' 6"  (1.676 m)    Wt 267 lb (121.1 kg)    SpO2 94%    BMI 43.09 kg/m  Wt Readings from  Last 3 Encounters:  09/27/21 267 lb (121.1 kg)  04/27/21 239 lb (108.4 kg)  12/28/20 239 lb (108.4 kg)     There is no immunization history on file for this patient.  Diabetic Foot Exam - Simple   No data filed     No results found for: TSH Lab Results  Component Value Date   WBC 18.3 (H) 10/26/2020   HGB 14.3 10/26/2020   HCT 42.2 10/26/2020   MCV 89.0 10/26/2020   PLT 291 10/26/2020   Lab Results  Component Value Date   NA 137 10/21/2020   K 3.9 10/21/2020   CO2 22 10/21/2020   GLUCOSE 106 (H) 10/21/2020   BUN 14 10/21/2020   CREATININE 0.99 10/21/2020   BILITOT 1.0 10/21/2020   ALKPHOS 83 10/21/2020   AST 30 10/21/2020   ALT 40 10/21/2020   PROT 7.9 10/21/2020   ALBUMIN 4.2 10/21/2020   CALCIUM 9.3 10/21/2020   ANIONGAP 9 10/21/2020   No results found for: CHOL No results found for: HDL No results found for: LDLCALC No results found for: TRIG No results found for: CHOLHDL Lab Results  Component Value Date   HGBA1C 5.7 (A) 09/27/2021   HGBA1C 5.7 09/27/2021   HGBA1C 5.7 09/27/2021   HGBA1C 5.7 09/27/2021       Assessment & Plan:   Problem List Items Addressed This Visit   None Visit Diagnoses     Healthcare maintenance    -  Primary   Relevant Orders   Glucose (CBG) (Completed)   HgB A1c (Completed): 5.7   POCT URINALYSIS DIP (CLINITEK) (Completed)   CBC with Differential/Platelet   Comprehensive metabolic panel   Lipid panel Encouraged continued diet and exercise efforts  Encouraged continued compliance with medication     Family history of prostate cancer in  father       Relevant Orders   PSA   Follow up in 6 mths for wellness exam, sooner as needed     I am having Daniil Labarge maintain his multivitamin with minerals, naproxen sodium, methocarbamol, oxyCODONE-acetaminophen, oxyCODONE-acetaminophen, and [facility] Aspirin Adult Low Dose.  No orders of the defined types were placed in this encounter.    Kathrynn Speed, NP

## 2021-09-27 NOTE — Patient Instructions (Signed)
You were seen today in the [facility] to establish care. Labs were collected, results will be available via MyChart or, if abnormal, you will be contacted by clinic staff.  Please follow up in 6 mths for wellness exam. °

## 2021-09-28 LAB — CBC WITH DIFFERENTIAL/PLATELET
Basophils Absolute: 0 10*3/uL (ref 0.0–0.2)
Basos: 0 %
EOS (ABSOLUTE): 0.1 10*3/uL (ref 0.0–0.4)
Eos: 1 %
Hematocrit: 47.1 % (ref 37.5–51.0)
Hemoglobin: 15.9 g/dL (ref 13.0–17.7)
Immature Grans (Abs): 0.1 10*3/uL (ref 0.0–0.1)
Immature Granulocytes: 1 %
Lymphocytes Absolute: 1.6 10*3/uL (ref 0.7–3.1)
Lymphs: 23 %
MCH: 29.4 pg (ref 26.6–33.0)
MCHC: 33.8 g/dL (ref 31.5–35.7)
MCV: 87 fL (ref 79–97)
Monocytes Absolute: 0.5 10*3/uL (ref 0.1–0.9)
Monocytes: 7 %
Neutrophils Absolute: 4.7 10*3/uL (ref 1.4–7.0)
Neutrophils: 68 %
Platelets: 245 10*3/uL (ref 150–450)
RBC: 5.4 x10E6/uL (ref 4.14–5.80)
RDW: 13.7 % (ref 11.6–15.4)
WBC: 6.9 10*3/uL (ref 3.4–10.8)

## 2021-09-28 LAB — COMPREHENSIVE METABOLIC PANEL
ALT: 26 IU/L (ref 0–44)
AST: 21 IU/L (ref 0–40)
Albumin/Globulin Ratio: 1.6 (ref 1.2–2.2)
Albumin: 4.2 g/dL (ref 3.8–4.9)
Alkaline Phosphatase: 134 IU/L — ABNORMAL HIGH (ref 44–121)
BUN/Creatinine Ratio: 15 (ref 9–20)
BUN: 15 mg/dL (ref 6–24)
Bilirubin Total: 0.4 mg/dL (ref 0.0–1.2)
CO2: 22 mmol/L (ref 20–29)
Calcium: 9 mg/dL (ref 8.7–10.2)
Chloride: 106 mmol/L (ref 96–106)
Creatinine, Ser: 0.99 mg/dL (ref 0.76–1.27)
Globulin, Total: 2.7 g/dL (ref 1.5–4.5)
Glucose: 89 mg/dL (ref 70–99)
Potassium: 4.2 mmol/L (ref 3.5–5.2)
Sodium: 141 mmol/L (ref 134–144)
Total Protein: 6.9 g/dL (ref 6.0–8.5)
eGFR: 92 mL/min/{1.73_m2} (ref >59)

## 2021-09-28 LAB — LIPID PANEL
Chol/HDL Ratio: 4.5 ratio (ref 0.0–5.0)
Cholesterol, Total: 180 mg/dL (ref 100–199)
HDL: 40 mg/dL (ref >39)
LDL Chol Calc (NIH): 115 mg/dL — ABNORMAL HIGH (ref 0–99)
Triglycerides: 137 mg/dL (ref 0–149)
VLDL Cholesterol Cal: 25 mg/dL (ref 5–40)

## 2021-09-28 LAB — PSA: Prostate Specific Ag, Serum: 0.6 ng/mL (ref 0.0–4.0)

## 2022-04-03 ENCOUNTER — Ambulatory Visit: Payer: BLUE CROSS/BLUE SHIELD | Admitting: Nurse Practitioner

## 2022-04-20 ENCOUNTER — Ambulatory Visit: Payer: BLUE CROSS/BLUE SHIELD | Admitting: Nurse Practitioner

## 2023-11-07 ENCOUNTER — Emergency Department (HOSPITAL_COMMUNITY): Payer: Self-pay

## 2023-11-07 ENCOUNTER — Encounter (HOSPITAL_COMMUNITY): Payer: Self-pay

## 2023-11-07 ENCOUNTER — Other Ambulatory Visit: Payer: Self-pay

## 2023-11-07 ENCOUNTER — Emergency Department (HOSPITAL_COMMUNITY)
Admission: EM | Admit: 2023-11-07 | Discharge: 2023-11-07 | Disposition: A | Discharge status: Home / Self Care | Payer: Self-pay | Attending: Emergency Medicine | Admitting: Emergency Medicine

## 2023-11-07 DIAGNOSIS — R42 Dizziness and giddiness: Secondary | ICD-10-CM | POA: Insufficient documentation

## 2023-11-07 DIAGNOSIS — J111 Influenza due to unidentified influenza virus with other respiratory manifestations: Secondary | ICD-10-CM

## 2023-11-07 DIAGNOSIS — J101 Influenza due to other identified influenza virus with other respiratory manifestations: Secondary | ICD-10-CM | POA: Insufficient documentation

## 2023-11-07 LAB — BASIC METABOLIC PANEL
Anion gap: 9 (ref 5–15)
BUN: 17 mg/dL (ref 6–20)
CO2: 22 mmol/L (ref 22–32)
Calcium: 9.2 mg/dL (ref 8.9–10.3)
Chloride: 107 mmol/L (ref 98–111)
Creatinine, Ser: 1.34 mg/dL — ABNORMAL HIGH (ref 0.61–1.24)
GFR, Estimated: >60 mL/min (ref >60)
Glucose, Bld: 140 mg/dL — ABNORMAL HIGH (ref 70–99)
Potassium: 3.8 mmol/L (ref 3.5–5.1)
Sodium: 138 mmol/L (ref 135–145)

## 2023-11-07 LAB — CBC
HCT: 48 % (ref 39.0–52.0)
Hemoglobin: 16.6 g/dL (ref 13.0–17.0)
MCH: 29.5 pg (ref 26.0–34.0)
MCHC: 34.6 g/dL (ref 30.0–36.0)
MCV: 85.4 fL (ref 80.0–100.0)
Platelets: 215 10*3/uL (ref 150–400)
RBC: 5.62 MIL/uL (ref 4.22–5.81)
RDW: 12.4 % (ref 11.5–15.5)
WBC: 7.2 10*3/uL (ref 4.0–10.5)
nRBC: 0 % (ref 0.0–0.2)

## 2023-11-07 LAB — RESP PANEL BY RT-PCR (RSV, FLU A&B, COVID)  RVPGX2
Influenza A by PCR: POSITIVE — AB
Influenza B by PCR: NEGATIVE
Resp Syncytial Virus by PCR: NEGATIVE
SARS Coronavirus 2 by RT PCR: NEGATIVE

## 2023-11-07 LAB — CBG MONITORING, ED: Glucose-Capillary: 129 mg/dL — ABNORMAL HIGH (ref 70–99)

## 2023-11-07 MED ORDER — MECLIZINE HCL 25 MG PO TABS: 25.0000 mg | ORAL_TABLET | Freq: Three times a day (TID) | ORAL | 0 refills | Status: AC | PRN

## 2023-11-07 MED ORDER — ONDANSETRON 4 MG PO TBDP: 4.0000 mg | ORAL_TABLET | Freq: Four times a day (QID) | ORAL | 0 refills | Status: AC | PRN

## 2023-11-07 MED ORDER — MECLIZINE HCL 25 MG PO TABS
25.0000 mg | ORAL_TABLET | Freq: Once | ORAL | Status: AC
  Administered 2023-11-07: 25 mg via ORAL
  Filled 2023-11-07: qty 1

## 2023-11-07 MED ORDER — PROCHLORPERAZINE EDISYLATE 10 MG/2ML IJ SOLN
10.0000 mg | Freq: Once | INTRAMUSCULAR | Status: AC
  Administered 2023-11-07: 10 mg via INTRAVENOUS
  Filled 2023-11-07: qty 2

## 2023-11-07 MED ORDER — IOHEXOL 350 MG/ML SOLN
75.0000 mL | Freq: Once | INTRAVENOUS | Status: AC | PRN
Start: 2023-11-07 — End: 2023-11-07
  Administered 2023-11-07: 75 mL via INTRAVENOUS

## 2023-11-07 NOTE — ED Triage Notes (Signed)
 Pt reports dizziness and nausea that started last night. Denies fevers and pain.

## 2023-11-07 NOTE — Discharge Instructions (Signed)
 Your lab work and CT scans were overall reassuring.  You did test positive for the flu, suspect this may have been the illness you experience last week and this can lead to inflammation in the inner ear that can cause dizziness.  To treat dizziness use prescribed meclizine 3 times daily as needed, can use prescribed Zofran as needed for nausea.  Perform the Epley maneuvers as written on your paperwork today.  Follow-up with your primary care provider within 1 week for recheck, return for new or worsening symptoms.

## 2023-11-07 NOTE — ED Notes (Signed)
 Pt ambulated with no complaints of being dizzy.

## 2023-11-07 NOTE — ED Provider Notes (Signed)
 [place] [facility]PARTMENT AT [facility] Provider Note   CSN: [id number] Arrival date & time: 11/07/23  4098     History  Chief Complaint  Patient presents with   Dizziness    [name] is a 53 y.o. male.  [name] is a 53 y.o. male with a history of arthritis, who presents to the emergency department for evaluation of dizziness, nausea and vomiting.  He reports symptoms started suddenly last night and have been constant since then.  Dizziness is worse with movement but has been constant and is still present at rest.  He reports a few episodes of vomiting overnight and ongoing nausea.  Denies associated visual changes or headaches, no numbness or weakness of the face, or extremities.  No associated chest pain, shortness of breath or lightheadedness.  Patient reports he had an issue several years ago where the right side of his face was drooping and he is not sure if this was a stroke or Bell's palsy.  He denies history of hypertension, hyperlipidemia or high blood pressure but reports he does not routinely see a regular doctor.  The history is provided by the patient, a relative and medical records.  Dizziness Associated symptoms: nausea and vomiting   Associated symptoms: no chest pain, no headaches, no hearing loss, no palpitations, no shortness of breath, no tinnitus and no weakness        Home Medications Prior to Admission medications   Medication Sig Start Date End Date Taking? Authorizing Provider  [facility] ASPIRIN ADULT LOW DOSE 81 MG chewable tablet CHEW 1 TABLET BY MOUTH EVERY DAY Patient not taking: Reported on 09/27/2021 01/03/21   Magnant, Joycie Peek, PA-C  methocarbamol (ROBAXIN) 500 MG tablet Take 1 tablet (500 mg total) by mouth every 8 (eight) hours as needed for muscle spasms. Patient not taking: Reported on 09/27/2021 10/25/20   Magnant, Joycie Peek, PA-C  Multiple Vitamin (MULTIVITAMIN WITH MINERALS) TABS tablet Take 1 tablet by mouth  daily. Patient not taking: Reported on 09/27/2021    [provider]  naproxen sodium (ALEVE) 220 MG tablet Take 220 mg by mouth daily as needed (pain). Patient not taking: Reported on 09/27/2021    [provider]  oxyCODONE-acetaminophen (PERCOCET/ROXICET) 5-325 MG tablet Take 1 tablet by mouth every 6 (six) hours as needed for severe pain. Patient not taking: Reported on 09/27/2021 11/07/20   Valeria Batman, MD      Allergies    Patient has no known allergies.    Review of Systems   Review of Systems  Constitutional:  Negative for chills and fever.  HENT:  Negative for ear pain, hearing loss and tinnitus.   Eyes:  Negative for photophobia and visual disturbance.  Respiratory:  Negative for cough and shortness of breath.   Cardiovascular:  Negative for chest pain and palpitations.  Gastrointestinal:  Positive for nausea and vomiting. Negative for abdominal pain.  Neurological:  Positive for dizziness and facial asymmetry. Negative for tremors, seizures, syncope, speech difficulty, weakness, light-headedness, numbness and headaches.  All other systems reviewed and are negative.   Physical Exam Updated Vital Signs BP (!) 124/92   Pulse 63   Temp 97.7 F (36.5 C)   Resp 16   SpO2 97%  Physical Exam Vitals and nursing note reviewed.  Constitutional:      General: He is not in acute distress.    Appearance: Normal appearance. He is well-developed. He is not diaphoretic.  HENT:     Head:  Normocephalic and atraumatic.     Right Ear: Tympanic membrane and ear canal normal.     Left Ear: Tympanic membrane and ear canal normal.     Mouth/Throat:     Mouth: Mucous membranes are moist.     Pharynx: Oropharynx is clear.  Eyes:     General:        Right eye: No discharge.        Left eye: No discharge.     Extraocular Movements: Extraocular movements intact.     Pupils: Pupils are equal, round, and reactive to light.     Comments: No nystagmus present   Cardiovascular:     Rate and Rhythm: Normal rate and regular rhythm.     Pulses: Normal pulses.     Heart sounds: Normal heart sounds.  Pulmonary:     Effort: Pulmonary effort is normal. No respiratory distress.     Breath sounds: Normal breath sounds. No wheezing or rales.     Comments: Respirations equal and unlabored, patient able to speak in full sentences, lungs clear to auscultation bilaterally  Abdominal:     General: Bowel sounds are normal. There is no distension.     Palpations: Abdomen is soft. There is no mass.     Tenderness: There is no abdominal tenderness. There is no guarding.     Comments: Abdomen soft, nondistended, nontender to palpation in all quadrants without guarding or peritoneal signs  Musculoskeletal:        General: No deformity.     Cervical back: Normal range of motion and neck supple.  Skin:    General: Skin is warm and dry.     Capillary Refill: Capillary refill takes less than 2 seconds.  Neurological:     Mental Status: He is alert and oriented to person, place, and time.     Coordination: Coordination normal.     Comments: Speech is clear, able to follow commands CN III-XII intact Normal strength in upper and lower extremities bilaterally including dorsiflexion and plantar flexion, strong and equal grip strength Sensation normal to light and sharp touch Moves extremities without ataxia, coordination intact Finger-to-nose testing intact without dysmetria bilaterally  Psychiatric:        Mood and Affect: Mood normal.        Behavior: Behavior normal.     ED Results / Procedures / Treatments   Labs (all labs ordered are listed, but only abnormal results are displayed) Labs Reviewed  RESP PANEL BY RT-PCR (RSV, FLU A&B, COVID)  RVPGX2 - Abnormal; Notable for the following components:      Result Value   Influenza A by PCR POSITIVE (*)    All other components within normal limits  BASIC METABOLIC PANEL - Abnormal; Notable for the following  components:   Glucose, Bld 140 (*)    Creatinine, Ser 1.34 (*)    All other components within normal limits  CBG MONITORING, ED - Abnormal; Notable for the following components:   Glucose-Capillary 129 (*)    All other components within normal limits  CBC    EKG EKG Interpretation Date/Time:  Thursday November 07 2023 08:26:30 EST Ventricular Rate:  65 PR Interval:  159 QRS Duration:  104 QT Interval:  411 QTC Calculation: 428 R Axis:   50  Text Interpretation: Sinus rhythm No significant change since prior 2/22 Confirmed by Meridee Score [phone]) on 11/07/2023 8:28:09 AM  Radiology CT ANGIO HEAD NECK W WO CM Result Date: 11/07/2023 CLINICAL DATA:  Provided  history: Vertigo, central. Additional history provided: Dizziness, nausea. EXAM: CT ANGIOGRAPHY HEAD AND NECK WITH AND WITHOUT CONTRAST TECHNIQUE: Multidetector CT imaging of the head and neck was performed using the standard protocol during bolus administration of intravenous contrast. Multiplanar CT image reconstructions and MIPs were obtained to evaluate the vascular anatomy. Carotid stenosis measurements (when applicable) are obtained utilizing NASCET criteria, using the distal internal carotid diameter as the denominator. RADIATION DOSE REDUCTION: This exam was performed according to the departmental dose-optimization program which includes automated exposure control, adjustment of the mA and/or kV according to patient size and/or use of iterative reconstruction technique. CONTRAST:  75mL OMNIPAQUE IOHEXOL 350 MG/ML SOLN COMPARISON:  None. FINDINGS: CT HEAD FINDINGS Brain: No age-advanced or lobar predominant cerebral atrophy. There is no acute intracranial hemorrhage. No demarcated cortical infarct. No extra-axial fluid collection. No evidence of an intracranial mass. No midline shift. Vascular: No hyperdense vessel. Skull: No calvarial fracture or aggressive osseous lesion. Sinuses/Orbits: No orbital mass or acute orbital finding.  No significant paranasal sinus disease. Review of the MIP images confirms the above findings CTA NECK FINDINGS Aortic arch: Standard aortic branching. The visualized thoracic aorta is normal in caliber. No hemodynamically significant innominate or proximal subclavian artery stenosis. Right carotid system: CCA and ICA patent within the neck without stenosis or significant atherosclerotic disease. No evidence of dissection Left carotid system: CCA and ICA patent within the neck without stenosis or significant atherosclerotic disease. No evidence of dissection. Vertebral arteries: Codominant and patent within the neck without stenosis or significant atherosclerotic disease. No evidence of dissection. Skeleton: No acute fracture or aggressive osseous lesion. Other neck: No neck mass or cervical lymphadenopathy. Upper chest: No consolidation within the imaged lung apices. Review of the MIP images confirms the above findings CTA HEAD FINDINGS Anterior circulation: The intracranial internal carotid arteries are patent. The M1 middle cerebral arteries are patent. No M2 proximal branch occlusion or high-grade proximal stenosis. The anterior cerebral arteries are patent. No intracranial aneurysm is identified. Posterior circulation: The intracranial vertebral arteries are patent. The basilar artery is patent. The posterior cerebral arteries are patent. A right posterior communicating artery is present. The left posterior communicating artery is diminutive or absent. Venous sinuses: Within the limitations of contrast timing, no convincing thrombus. Anatomic variants: As described. Review of the MIP images confirms the above findings IMPRESSION: Non-contrast head CT: No evidence of an acute intracranial abnormality. CTA neck: The common carotid, internal carotid and vertebral arteries are patent within the neck without stenosis or significant atherosclerotic disease. No evidence of dissection. CTA head: No proximal intracranial  large vessel occlusion or high-grade proximal arterial stenosis identified. Electronically Signed   By: Jackey Loge D.O.   On: 11/07/2023 10:32     Procedures Procedures    Medications Ordered in ED Medications  meclizine (ANTIVERT) tablet 25 mg (25 mg Oral Given 11/07/23 0927)  iohexol (OMNIPAQUE) 350 MG/ML injection 75 mL (75 mLs Intravenous Contrast Given 11/07/23 1014)  prochlorperazine (COMPAZINE) injection 10 mg (10 mg Intravenous Given 11/07/23 1050)    ED Course/ Medical Decision Making/ A&P                                 Medical Decision Making Amount and/or Complexity of Data Reviewed Labs: ordered. Radiology: ordered.  Risk Prescription drug management.   53 year old male presents complaining of room spinning dizziness and nausea that started suddenly last night.  It is persistent but  worse when turning his head to the left.  No associated headache, visual changes, numbness or weakness.  Unclear and patient history if he had prior stroke versus Bell's palsy  On arrival patient with stable vitals, well-appearing with no active vomiting, no nystagmus or focal neurologic deficits on exam.  Basic labs and respiratory testing ordered from triage which shows no leukocytosis, normal hemoglobin, mildly elevated creatinine of 1.34.  Most recent comparison from 2 years ago was 0.99.  IV fluids given.  Patient is positive for influenza A.  He denies current upper respiratory symptoms but does report that he was sick with cough and congestion last week.  Patient treated with meclizine for dizziness with minimal improvement.  CT angio of the head and neck obtained fortunately with no intracranial abnormalities or vascular stenosis aneurysm or atherosclerotic disease noted.  Patient given Compazine for dizziness and after that patient reports feeling much better with resolution of his dizziness.  Able to ambulate in the hallway with no return of dizziness and no assistance required.   Suspect inflammation of the inner ear in the setting of recent viral illness.  Will treat with Epley maneuvers, meclizine and Zofran as needed.  Discussed PCP follow-up and return precautions.  Discharged home in good condition.        Final Clinical Impression(s) / ED Diagnoses Final diagnoses:  Vertigo  Influenza    Rx / DC Orders ED Discharge Orders          Ordered    meclizine (ANTIVERT) 25 MG tablet  3 times daily PRN        11/07/23 1334    ondansetron (ZOFRAN-ODT) 4 MG disintegrating tablet  Every 6 hours PRN        11/07/23 1334              Rosezella Rumpf, [place] 11/13/23 1955    Terrilee Files, MD 11/14/23 1010
